# Patient Record
Sex: Male | Born: 1945 | Race: White | Hispanic: No | Smoking: Former smoker
Health system: Southern US, Community
[De-identification: ages and names within clinical notes are randomized; demographics above are authoritative.]

## PROBLEM LIST (undated history)

## (undated) DIAGNOSIS — R0602 Shortness of breath: Secondary | ICD-10-CM

## (undated) DIAGNOSIS — I10 Essential (primary) hypertension: Secondary | ICD-10-CM

## (undated) DIAGNOSIS — N182 Chronic kidney disease, stage 2 (mild): Secondary | ICD-10-CM

## (undated) DIAGNOSIS — N529 Male erectile dysfunction, unspecified: Secondary | ICD-10-CM

## (undated) DIAGNOSIS — K219 Gastro-esophageal reflux disease without esophagitis: Secondary | ICD-10-CM

## (undated) DIAGNOSIS — C61 Malignant neoplasm of prostate: Secondary | ICD-10-CM

## (undated) DIAGNOSIS — I639 Cerebral infarction, unspecified: Secondary | ICD-10-CM

## (undated) DIAGNOSIS — I2510 Atherosclerotic heart disease of native coronary artery without angina pectoris: Secondary | ICD-10-CM

## (undated) DIAGNOSIS — I219 Acute myocardial infarction, unspecified: Secondary | ICD-10-CM

## (undated) DIAGNOSIS — Z86010 Personal history of colonic polyps: Secondary | ICD-10-CM

## (undated) DIAGNOSIS — Z87891 Personal history of nicotine dependence: Secondary | ICD-10-CM

## (undated) DIAGNOSIS — Z85038 Personal history of other malignant neoplasm of large intestine: Secondary | ICD-10-CM

## (undated) DIAGNOSIS — E785 Hyperlipidemia, unspecified: Secondary | ICD-10-CM

## (undated) HISTORY — PX: COLON SURGERY: SHX602

## (undated) HISTORY — DX: Personal history of other malignant neoplasm of large intestine: Z85.038

## (undated) HISTORY — DX: Hyperlipidemia, unspecified: E78.5

## (undated) HISTORY — DX: Gastro-esophageal reflux disease without esophagitis: K21.9

## (undated) HISTORY — DX: Male erectile dysfunction, unspecified: N52.9

## (undated) HISTORY — DX: Personal history of colonic polyps: Z86.010

## (undated) HISTORY — DX: Essential (primary) hypertension: I10

## (undated) HISTORY — DX: Malignant neoplasm of prostate: C61

## (undated) HISTORY — DX: Personal history of nicotine dependence: Z87.891

## (undated) HISTORY — PX: LIVER BIOPSY: SHX301

---

## 2006-09-10 DIAGNOSIS — I219 Acute myocardial infarction, unspecified: Secondary | ICD-10-CM

## 2006-09-10 HISTORY — DX: Acute myocardial infarction, unspecified: I21.9

## 2008-03-24 ENCOUNTER — Observation Stay (HOSPITAL_COMMUNITY): Admission: EM | Admit: 2008-03-24 | Discharge: 2008-03-25 | Payer: Self-pay | Admitting: Emergency Medicine

## 2008-03-24 ENCOUNTER — Ambulatory Visit: Payer: Self-pay | Admitting: Cardiology

## 2008-03-25 ENCOUNTER — Encounter (INDEPENDENT_AMBULATORY_CARE_PROVIDER_SITE_OTHER): Payer: Self-pay | Admitting: Internal Medicine

## 2008-03-25 ENCOUNTER — Ambulatory Visit: Payer: Self-pay | Admitting: Vascular Surgery

## 2008-06-14 ENCOUNTER — Ambulatory Visit: Payer: Self-pay | Admitting: Internal Medicine

## 2008-06-28 ENCOUNTER — Ambulatory Visit: Payer: Self-pay | Admitting: Internal Medicine

## 2008-06-28 ENCOUNTER — Encounter: Payer: Self-pay | Admitting: Family Medicine

## 2008-06-28 LAB — CONVERTED CEMR LAB
ALT: 13 units/L (ref 0–53)
AST: 12 units/L (ref 0–37)
Basophils Absolute: 0 10*3/uL (ref 0.0–0.1)
Basophils Relative: 0 % (ref 0–1)
CO2: 21 meq/L (ref 19–32)
Cholesterol: 126 mg/dL (ref 0–200)
Creatinine, Ser: 1.28 mg/dL (ref 0.40–1.50)
Eosinophils Relative: 3 % (ref 0–5)
HCT: 44 % (ref 39.0–52.0)
LDL Cholesterol: 56 mg/dL (ref 0–99)
Lymphocytes Relative: 33 % (ref 12–46)
MCHC: 31.8 g/dL (ref 30.0–36.0)
Neutro Abs: 3.9 10*3/uL (ref 1.7–7.7)
PSA: 4.53 ng/mL — ABNORMAL HIGH (ref 0.10–4.00)
Platelets: 212 10*3/uL (ref 150–400)
RDW: 13.4 % (ref 11.5–15.5)
Sodium: 143 meq/L (ref 135–145)
TSH: 4.267 microintl units/mL (ref 0.350–4.50)
Total Bilirubin: 0.5 mg/dL (ref 0.3–1.2)
Total CHOL/HDL Ratio: 2.2
Total Protein: 6.3 g/dL (ref 6.0–8.3)
VLDL: 13 mg/dL (ref 0–40)

## 2008-07-05 ENCOUNTER — Ambulatory Visit: Payer: Self-pay | Admitting: Family Medicine

## 2009-08-23 ENCOUNTER — Ambulatory Visit (HOSPITAL_COMMUNITY): Admission: RE | Admit: 2009-08-23 | Discharge: 2009-08-23 | Payer: Self-pay | Admitting: Urology

## 2009-08-26 DIAGNOSIS — E876 Hypokalemia: Secondary | ICD-10-CM

## 2009-08-26 DIAGNOSIS — N182 Chronic kidney disease, stage 2 (mild): Secondary | ICD-10-CM

## 2009-08-26 DIAGNOSIS — E785 Hyperlipidemia, unspecified: Secondary | ICD-10-CM

## 2009-08-26 HISTORY — DX: Chronic kidney disease, stage 2 (mild): N18.2

## 2009-08-26 HISTORY — DX: Hyperlipidemia, unspecified: E78.5

## 2009-08-29 ENCOUNTER — Ambulatory Visit: Payer: Self-pay | Admitting: Internal Medicine

## 2009-08-29 ENCOUNTER — Ambulatory Visit: Admission: RE | Admit: 2009-08-29 | Discharge: 2009-09-09 | Payer: Self-pay | Admitting: Radiation Oncology

## 2009-08-29 DIAGNOSIS — N529 Male erectile dysfunction, unspecified: Secondary | ICD-10-CM

## 2009-08-29 DIAGNOSIS — Z87891 Personal history of nicotine dependence: Secondary | ICD-10-CM

## 2009-08-29 DIAGNOSIS — I10 Essential (primary) hypertension: Secondary | ICD-10-CM

## 2009-08-29 DIAGNOSIS — Z8679 Personal history of other diseases of the circulatory system: Secondary | ICD-10-CM | POA: Insufficient documentation

## 2009-08-29 DIAGNOSIS — K219 Gastro-esophageal reflux disease without esophagitis: Secondary | ICD-10-CM

## 2009-08-29 HISTORY — DX: Male erectile dysfunction, unspecified: N52.9

## 2009-08-29 HISTORY — DX: Personal history of nicotine dependence: Z87.891

## 2009-08-29 HISTORY — DX: Essential (primary) hypertension: I10

## 2009-08-29 HISTORY — DX: Gastro-esophageal reflux disease without esophagitis: K21.9

## 2009-08-29 LAB — CONVERTED CEMR LAB
BUN: 8 mg/dL (ref 6–23)
Basophils Relative: 0.7 % (ref 0.0–3.0)
CO2: 29 meq/L (ref 19–32)
Calcium: 9 mg/dL (ref 8.4–10.5)
Creatinine, Ser: 1.1 mg/dL (ref 0.4–1.5)
Eosinophils Absolute: 0.1 10*3/uL (ref 0.0–0.7)
Eosinophils Relative: 1.5 % (ref 0.0–5.0)
GFR calc non Af Amer: 71.65 mL/min (ref >60)
Glucose, Bld: 93 mg/dL (ref 70–99)
HDL: 68.9 mg/dL (ref >39.00)
Hemoglobin: 14.7 g/dL (ref 13.0–17.0)
MCHC: 34.1 g/dL (ref 30.0–36.0)
MCV: 100 fL (ref 78.0–100.0)
Monocytes Absolute: 0.4 10*3/uL (ref 0.1–1.0)
Neutro Abs: 4.2 10*3/uL (ref 1.4–7.7)
Neutrophils Relative %: 64 % (ref 43.0–77.0)
RBC: 4.3 M/uL (ref 4.22–5.81)
WBC: 6.4 10*3/uL (ref 4.5–10.5)

## 2009-09-14 ENCOUNTER — Ambulatory Visit: Admission: RE | Admit: 2009-09-14 | Discharge: 2009-12-08 | Payer: Self-pay | Admitting: Radiation Oncology

## 2009-11-20 IMAGING — CR DG CHEST 2V
2 series · 2 of 2 positions shown · non-contrast
Comparison: None

CLINICAL DATA: Dizziness, visual problems

CHEST - 2 VIEW

[w chest pa]
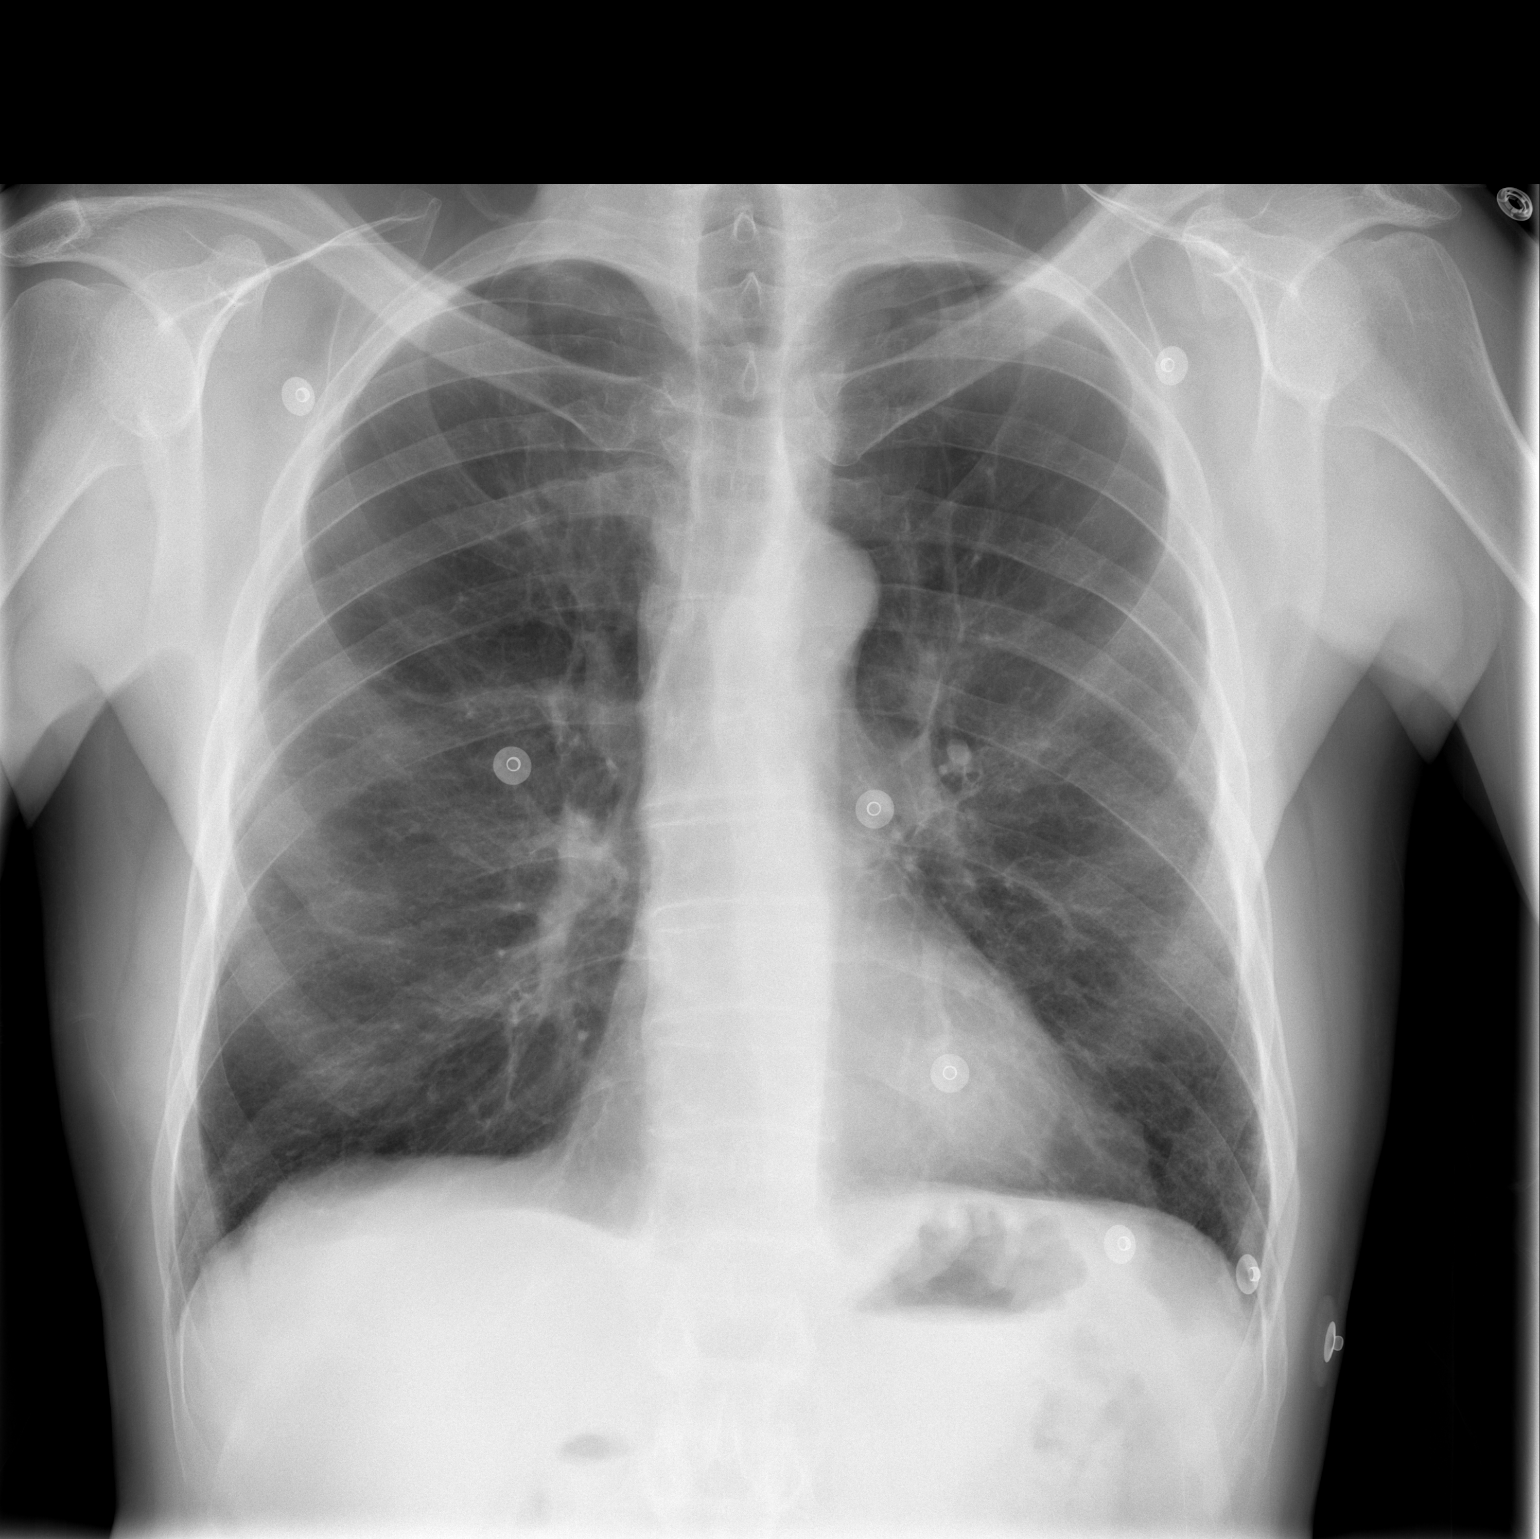

[w chest lat]
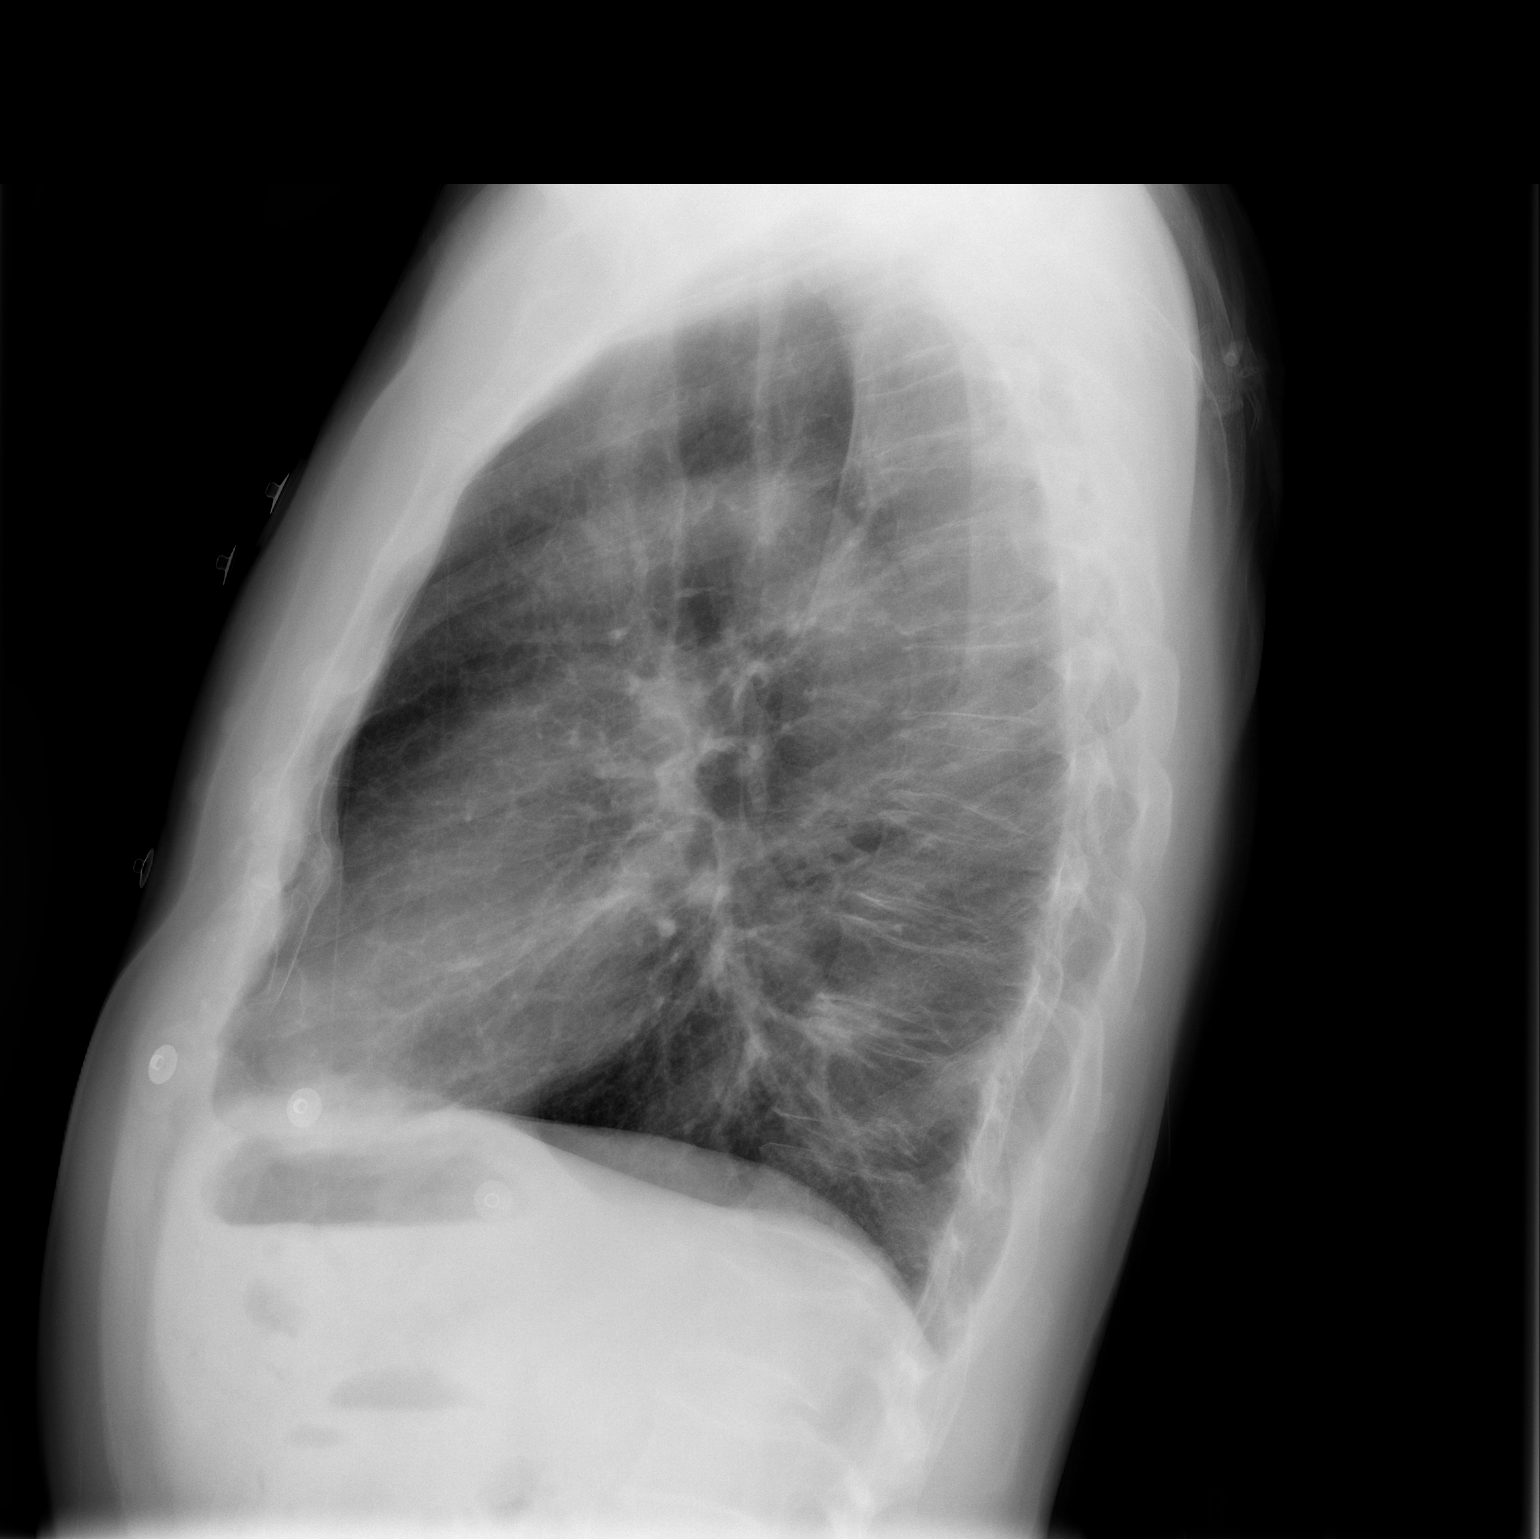

[2 of 2 positions shown; findings below may reference images not displayed]

FINDINGS: The cardiomediastinal silhouette is unremarkable.  Mild
hyperinflation is seen.  No acute infiltrate or pleural effusion.
No pulmonary edema.
IMPRESSION: Mild hyperinflation.  No acute infiltrate or pleural effusion.

## 2009-12-09 ENCOUNTER — Ambulatory Visit: Admission: RE | Admit: 2009-12-09 | Discharge: 2010-02-02 | Payer: Self-pay | Admitting: Radiation Oncology

## 2009-12-27 ENCOUNTER — Ambulatory Visit: Payer: Self-pay | Admitting: Internal Medicine

## 2010-01-09 LAB — CBC WITH DIFFERENTIAL/PLATELET
Basophils Absolute: 0 10*3/uL (ref 0.0–0.1)
Eosinophils Absolute: 0.1 10*3/uL (ref 0.0–0.5)
HGB: 13.9 g/dL (ref 13.0–17.1)
MONO#: 0.4 10*3/uL (ref 0.1–0.9)
NEUT#: 3.8 10*3/uL (ref 1.5–6.5)
Platelets: 229 10*3/uL (ref 140–400)
RBC: 4.14 10*6/uL — ABNORMAL LOW (ref 4.20–5.82)
RDW: 13.1 % (ref 11.0–14.6)
WBC: 5.4 10*3/uL (ref 4.0–10.3)

## 2010-04-25 ENCOUNTER — Ambulatory Visit: Payer: Self-pay | Admitting: Internal Medicine

## 2010-04-25 DIAGNOSIS — K5900 Constipation, unspecified: Secondary | ICD-10-CM | POA: Insufficient documentation

## 2010-04-25 LAB — CONVERTED CEMR LAB
Magnesium: 2.1 mg/dL (ref 1.5–2.5)
Potassium: 4.1 meq/L (ref 3.5–5.1)

## 2010-04-27 ENCOUNTER — Encounter: Payer: Self-pay | Admitting: Gastroenterology

## 2010-05-26 ENCOUNTER — Telehealth: Payer: Self-pay | Admitting: Internal Medicine

## 2010-06-14 ENCOUNTER — Ambulatory Visit: Payer: Self-pay | Admitting: Gastroenterology

## 2010-06-14 DIAGNOSIS — C61 Malignant neoplasm of prostate: Secondary | ICD-10-CM

## 2010-06-14 DIAGNOSIS — Z85038 Personal history of other malignant neoplasm of large intestine: Secondary | ICD-10-CM

## 2010-06-14 HISTORY — DX: Personal history of other malignant neoplasm of large intestine: Z85.038

## 2010-06-14 HISTORY — DX: Malignant neoplasm of prostate: C61

## 2010-06-15 ENCOUNTER — Ambulatory Visit: Payer: Self-pay | Admitting: Gastroenterology

## 2010-06-19 ENCOUNTER — Encounter: Payer: Self-pay | Admitting: Gastroenterology

## 2010-07-19 ENCOUNTER — Telehealth: Payer: Self-pay | Admitting: Internal Medicine

## 2010-07-19 ENCOUNTER — Ambulatory Visit: Payer: Self-pay | Admitting: Gastroenterology

## 2010-07-19 DIAGNOSIS — Z860100 Personal history of colon polyps, unspecified: Secondary | ICD-10-CM

## 2010-07-19 HISTORY — DX: Personal history of colon polyps, unspecified: Z86.0100

## 2010-07-19 HISTORY — DX: Personal history of colonic polyps: Z86.010

## 2010-08-22 ENCOUNTER — Ambulatory Visit: Payer: Self-pay | Admitting: Internal Medicine

## 2010-10-02 ENCOUNTER — Encounter: Payer: Self-pay | Admitting: Internal Medicine

## 2010-10-10 NOTE — Assessment & Plan Note (Signed)
Summary: 4 MTH FU  STC   Vital Signs:  Patient profile:   65 year old male Height:      66 inches (167.64 cm) Weight:      139.0 pounds (63.18 kg) O2 Sat:      97 % on Room air Temp:     97.8 degrees F (36.56 degrees C) oral Pulse rate:   67 / minute BP sitting:   148 / 78  (left arm) Cuff size:   regular  Vitals Entered By: Orlan Leavens (December 27, 2009 1:09 PM)  O2 Flow:  Room air CC: 4 month follow-up/ Req refill on metoprolo, & omprazole Is Patient Diabetic? No Pain Assessment Patient in pain? no        Primary Care Provider:  Newt Lukes MD  CC:  4 month follow-up/ Req refill on metoprolo and & omprazole.  History of Present Illness: here for 4 month followup  1) HTN - reports compliance with ongoing medical treatment and no changes in medication dose or frequency. denies adverse side effects related to current therapy.   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) - no dysarthria, dysphagia or UE weakness - no new signs or symptoms of CVA since '92  4) ?prostate ca - following wit uro (nesi) for same - reports recent bx showed no cancer - denies hematurai or dysuria - occ BPF symptoms   5) hx migraines - none >4weeks, usually 1/ week prior to being on topamax - followed by neuro for same - reports recnt MRI at hosp request refill on this and other meds  6) - ED - previously on viagra, unable to afford levitra - now wants to try cialis - no CP or probs with med or tx - uses 2-3/mo  Clinical Review Panels:  Immunizations   Last Tetanus Booster:  Historical (09/11/2003)  Lipid Management   Cholesterol:  217 (08/29/2009)   LDL (bad choesterol):  56 (06/28/2008)   HDL (good cholesterol):  68.90 (08/29/2009)  CBC   WBC:  6.4 (08/29/2009)   RBC:  4.30 (08/29/2009)  Hgb:  14.7 (08/29/2009)   Hct:  43.0 (08/29/2009)   Platelets:  226.0 (08/29/2009)   MCV  100.0 (08/29/2009)   MCHC  34.1 (08/29/2009)   RDW  12.1 (08/29/2009)   PMN:  64.0 (08/29/2009)   Lymphs:  27.1 (08/29/2009)   Monos:  6.7 (08/29/2009)   Eosinophils:  1.5 (08/29/2009)   Basophil:  0.7 (08/29/2009)  Complete Metabolic Panel   Glucose:  93 (08/29/2009)   Sodium:  140 (08/29/2009)   Potassium:  4.0 (08/29/2009)   Chloride:  105 (08/29/2009)   CO2:  29 (08/29/2009)   BUN:  8 (08/29/2009)   Creatinine:  1.1 (08/29/2009)   Albumin:  4.1 (06/28/2008)   Total Protein:  6.3 (06/28/2008)   Calcium:  9.0 (08/29/2009)   Total Bili:  0.5 (06/28/2008)   Alk Phos:  59 (06/28/2008)   SGPT (ALT):  13 (06/28/2008)   SGOT (AST):  12 (06/28/2008)   Current Medications (verified): 1)  Omeprazole 20 Mg Cpdr (Omeprazole) .... Take 1 By Mouth Qd 2)  Ecotrin 325 Mg Tbec (Aspirin) .... Take 1 By Mouth Qd 3)  Metoprolol Tartrate 25 Mg Tabs (Metoprolol Tartrate) .... Take 1 Two Times A Day 4)  Topiramate  100 Mg Tabs (Topiramate) .... Take 1 At Bedtime 5)  Levitra 10 Mg Tabs (Vardenafil Hcl) .Marland Kitchen.. 1 By Mouth 1 Hour Prior To Intercourse  Allergies (verified): No Known Drug Allergies  Past History:  Past Medical History: Hyperlipidemia Hypertension GERD ED Cerebrovascular accident, hx of -'92, residual RLE weakness prostate cancer, hx   MD rooster: neuro - ?willis uro -nessi rad onc Tawni Millers  Past Surgical History: Reviewed history from 08/29/2009 and no changes required. none  Review of Systems  The patient denies fever, weight loss, chest pain, syncope, and headaches.    Physical Exam  General:  alert, well-developed, well-nourished, and cooperative to examination.    Lungs:  normal respiratory effort, no intercostal retractions or use of accessory muscles; normal breath sounds bilaterally - no crackles and no wheezes.    Heart:  normal rate, regular rhythm, no murmur, and  no rub. BLE without edema.  Neurologic:  alert & oriented X3 and cranial nerves II-XII symetrically intact.  strength normal in all extremities except 4/5  prox RLE, sensation intact to light touch, and gait affected by RLE. speech fluent without dysarthria or aphasia; follows commands with good comprehension.    Impression & Recommendations:  Problem # 1:  HYPERTENSION (ICD-401.9) reports morning AM - His updated medication list for this problem includes:    Metoprolol Tartrate 25 Mg Tabs (Metoprolol tartrate) .Marland Kitchen... Take 1 two times a day  BP today: 148/78 Prior BP: 132/82 (08/29/2009)  Labs Reviewed: K+: 4.0 (08/29/2009) Creat: : 1.1 (08/29/2009)   Chol: 217 (08/29/2009)   HDL: 68.90 (08/29/2009)   LDL: 56 (06/28/2008)   TG: 80.0 (08/29/2009)  Problem # 2:  DYSLIPIDEMIA (ICD-272.4)  Labs Reviewed: SGOT: 12 (06/28/2008)   SGPT: 13 (06/28/2008)   HDL:68.90 (08/29/2009), 57 (06/28/2008)  LDL:56 (06/28/2008)  Chol:217 (08/29/2009), 126 (06/28/2008)  Trig:80.0 (08/29/2009), 66 (06/28/2008)  Problem # 3:  GERD (ICD-530.81)  symptoms well controlled with PPI -   His updated medication list for this problem includes:    Omeprazole 20 Mg Cpdr (Omeprazole) .Marland Kitchen... Take 1 by mouth qd  Labs Reviewed: Hgb: 14.7 (08/29/2009)   Hct: 43.0 (08/29/2009)  Problem # 4:  CEREBROVASCULAR ACCIDENT, HX OF (ICD-V12.50)  residual RLE deficits -  cont med mgmt and risk reduction as able - old hosp records reviewed on hosp EMR system including imaging and labs -  no new MRI evident in [facility] system for me to review-  Complete Medication List: 1)  Omeprazole 20 Mg Cpdr (Omeprazole) .... Take 1 by mouth qd 2)  Ecotrin 325 Mg Tbec (Aspirin) .... Take 1 by mouth qd 3)  Metoprolol Tartrate 25 Mg Tabs (Metoprolol tartrate) .... Take 1 two times a day 4)  Topiramate 100 Mg Tabs (Topiramate) .... Take 1 at bedtime 5)  Cialis 10 Mg Tabs (Tadalafil) .Marland Kitchen.. 1 by mouth every 3 days as needed  Patient  Instructions: 1)  it was good to see you today.  2)  change levitra to cialis as discussed - samples given today and new prescription for same - 3)  no other medication changes - 4)  Please schedule a follow-up appointment in 4 months, sooner if problems.  Prescriptions: CIALIS 10 MG TABS (TADALAFIL) 1 by mouth every 3 days as needed  #3 x 1   Entered and Authorized by:   Newt Lukes MD   Signed by:   Newt Lukes MD on 12/27/2009   Method used:   Print then Give to Patient   RxID:  [id number] METOPROLOL TARTRATE 25 MG TABS (METOPROLOL TARTRATE) take 1 two times a day  #60 x 5   Entered by:   Orlan Leavens   Authorized by:   Newt Lukes MD   Signed by:   Orlan Leavens on 12/27/2009   Method used:   Electronically to        [facility] [phone]* (retail)       [address]       [place], [place]  [address]       Ph: [phone]       Fax: [phone]   RxID:   [id number] OMEPRAZOLE 20 MG CPDR (OMEPRAZOLE) take 1 by mouth qd  #30 x 5   Entered by:   Orlan Leavens   Authorized by:   Newt Lukes MD   Signed by:   Orlan Leavens on 12/27/2009   Method used:   Electronically to        [facility]. [phone]* (retail)       [address]       [place], [place]  [address]       Ph: [phone]       Fax: [phone]   RxID:   [id number]

## 2010-10-10 NOTE — Assessment & Plan Note (Signed)
Summary: return visit after colonoscopy/tcw   History of Present Illness Visit Type: Follow-up Visit Primary GI MD: Melvia Heaps MD [facility] Primary Provider: Newt Lukes MD Requesting Provider: na Chief Complaint: F/u from colon.  Pt states that he feels better but stiil having constipation and stool softner and Miralax is not working and he stopped using them  History of Present Illness:   Terry Villa has returned following colonoscopy.  A large pedunculated adenomatous polyp was removed.  Despite taking MiraLax he still has a difficult time evacuatehis bowels.  He requires manual digitation.  Stools are soft, not hard.   GI Review of Systems      Denies abdominal pain, acid reflux, belching, bloating, chest pain, dysphagia with liquids, dysphagia with solids, heartburn, loss of appetite, nausea, vomiting, vomiting blood, weight loss, and  weight gain.      Reports constipation.     Denies anal fissure, black tarry stools, change in bowel habit, diarrhea, diverticulosis, fecal incontinence, heme positive stool, hemorrhoids, irritable bowel syndrome, jaundice, light color stool, liver problems, rectal bleeding, and  rectal pain.    Current Medications (verified): 1)  Omeprazole 20 Mg Cpdr (Omeprazole) .... Take 1 By Mouth Qd 2)  Ecotrin 325 Mg Tbec (Aspirin) .... Take 1 By Mouth Qd 3)  Metoprolol Tartrate 25 Mg Tabs (Metoprolol Tartrate) .... Take 1 Two Times A Day 4)  Topiramate 100 Mg Tabs (Topiramate) .... Take 1 At Bedtime 5)  Levitra 10 Mg Tabs (Vardenafil Hcl) .Marland Kitchen.. 1 By Mouth As Needed For Sexual Function 6)  Tamsulosin Hcl 0.4 Mg Caps (Tamsulosin Hcl) .... 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 liver biopsy Colon Resection  Family History: unknown per pt -  Family History of Clotting disorder: Uncle Family History of Heart Disease: Father Family History of Breast Cancer:Mother No FH of Colon Cancer:  Social History: Reviewed history from 06/14/2010 and no changes required. Former Smoker widowed, lives alone - but has lady friend enjoys spending time with his 7 g-kids Alcohol Use - yes Daily Caffeine Use Illicit Drug Use - no Occupation: Furniture  Review of Systems       The patient complains of arthritis/joint pain and muscle pains/cramps.  The patient denies allergy/sinus, anemia, anxiety-new, back pain, blood in urine, breast changes/lumps, change in vision, confusion, cough, coughing up blood, depression-new, fainting, fatigue, fever, headaches-new, hearing problems, heart murmur, heart rhythm changes, itching, menstrual pain, night sweats, nosebleeds, pregnancy symptoms, shortness of breath, skin rash, sleeping problems, sore throat, swelling of feet/legs, swollen lymph glands, thirst - excessive , urination - excessive , urination changes/pain, urine leakage, vision changes, and voice change.    Vital Signs:  Patient profile:   65 year old male Height:      66 inches Weight:      142 pounds BMI:     23.00 BSA:     1.73 Pulse rate:   60 / minute Pulse rhythm:   regular BP sitting:  120 / 76  (left arm) Cuff size:   regular  Vitals Entered By: Ok Anis CMA (July 19, 2010 9:25 AM)   Impression & Recommendations:  Problem # 1:   CONSTIPATION (ICD-564.00) He may have a problem with pelvic floor dysfunction where he does not have the strength to evacuate his stool.  Recommendations #1 trial of lactulose 15 cc b.i.d.  If unsuccessful would consider anal manometry  Problem # 2:  PERSONAL HISTORY OF COLONIC POLYPS (ICD-V12.72) Plan followup colonoscopy in 3 years due to  polyp size  Problem # 3:  CARCINOMA, COLON, HX OF (ICD-V10.05) Assessment: Comment Only  Patient Instructions: 1)  Copy sent to : Newt Lukes MD 2)  Your medication will be at your pharmacy today 3)  Call back as needed  4)  The medication list was reviewed and reconciled.  All changed / newly prescribed medications were explained.  A complete medication list was provided to the patient / caregiver. Prescriptions: LACTULOSE 10 GM/15ML SOLN (LACTULOSE) take 1 tablespoon twice a day  #500 x 5   Entered and Authorized by:   Louis Meckel MD   Signed by:   Louis Meckel MD on 07/19/2010   Method used:   Electronically to        [facility]. [phone]* (retail)       [address]       [place], [place]  [address]       Ph: [phone]       Fax: [phone]   RxID:   [id number]

## 2010-10-10 NOTE — Procedures (Signed)
Summary: Colonoscopy  Patient: Terry Villa Note: All result statuses are Final unless otherwise noted.  Tests: (1) Colonoscopy (COL)   COL Colonoscopy           DONE     [facility]     [address]     [place], [place]  [address]           COLONOSCOPY PROCEDURE REPORT           PATIENT:  Terry Villa, Terry Villa  MR#:  [id number]     BIRTHDATE:  05/07/1946, 64 yrs. old  GENDER:  male           ENDOSCOPIST:  Barbette Hair. Arlyce Dice, MD     Referred by:  Rene Paci, M.D.           PROCEDURE DATE:  06/15/2010     PROCEDURE:  Colonoscopy with snare polypectomy     ASA CLASS:  Class II     INDICATIONS:  1) constipation  2) questionable history of colon     cancer           MEDICATIONS:   Fentanyl 50 mcg IV, Versed 6 mg IV           DESCRIPTION OF PROCEDURE:   After the risks benefits and     alternatives of the procedure were thoroughly explained, informed     consent was obtained.  Digital rectal exam was performed and     revealed no abnormalities.   The LB [id number] endoscope     was introduced through the anus and advanced to the cecum, which     was identified by the ileocecal valve, limited by poor     preparation.  Significant amount of retained solid stool in right     colon.  Moderate amount of liquid stool left colon.  The quality     of the prep was Miralax fair.  The instrument was then slowly     withdrawn as the colon was fully examined.     <<PROCEDUREIMAGES>>           FINDINGS:  A pedunculated polyp was found in the sigmoid colon. It     was 1.8 cm in size. It was found 20 cm from the point of entry.     Polyp was snared, then cauterized with monopolar cautery.     Retrieval was successful (see image10 and image11). snare polyp     Mild diverticulosis was found in the sigmoid colon (see image7 and     image6).  This was otherwise a normal examination of the colon     (see image1, image2, image3, image4, image5, image8, and image12).     Retroflexed  views in the rectum revealed no abnormalities.    The     time to cecum =  5.25  minutes. The scope was then withdrawn (time     =  9.25  min) from the patient and the procedure completed.           COMPLICATIONS:  None           ENDOSCOPIC IMPRESSION:     1) 1.8 cm pedunculated polyp in the sigmoid colon     2) Mild diverticulosis in the sigmoid colon     3) Otherwise normal examination     RECOMMENDATIONS:     1) followup colonoscopy in 3 years, because of polyp size and     limitation of today's exam  2) high fiber diet     3) miralax as needed for constipation     4) OV 1 month           REPEAT EXAM:  In 3 year(s) for Colonoscopy.           ______________________________     Barbette Hair. Arlyce Dice, MD           CC:           n.     eSIGNED:   Barbette Hair. Kaplan at 06/15/2010 04:01 PM           Terry Villa, [id number]  Note: An exclamation mark (!) indicates a result that was not dispersed into the flowsheet. Document Creation Date: 06/15/2010 4:03 PM _______________________________________________________________________  (1) Order result status: Final Collection or observation date-time: 06/15/2010 15:51 Requested date-time:  Receipt date-time:  Reported date-time:  Referring Physician:   Ordering Physician: Melvia Heaps [id number]) Specimen Source:  Source: Launa Grill Order Number: [id number] Lab site:   Appended Document: Colonoscopy     Procedures Next Due Date:    Colonoscopy: 06/2013

## 2010-10-10 NOTE — Letter (Signed)
Summary: New Patient letter  [facility]  [address] [place], [place] [address]   Phone: [phone]  Fax: [phone]       04/27/2010 MRN: [id number]  [facility] [address] [place], [place]  [address]  Dear Terry Villa,  Welcome to the Gastroenterology Division at [facility].    You are scheduled to see Dr.  Arlyce Dice  on 06-14-10 at 10:45am on the 3rd floor at [facility], [address]  We ask that you try to arrive at our office 15 minutes prior to your appointment time to allow for check-in.  We would like you to complete the enclosed self-administered evaluation form prior to your visit and bring it with you on the day of your appointment.  We will review it with you.  Also, please bring a complete list of all your medications or, if you prefer, bring the medication bottles and we will list them.  Please bring your insurance card so that we may make a copy of it.  If your insurance requires a referral to see a specialist, please bring your referral form from your primary care physician.  Co-payments are due at the time of your visit and may be paid by cash, check or credit card.     Your office visit will consist of a consult with your physician (includes a physical exam), any laboratory testing he/she may order, scheduling of any necessary diagnostic testing (e.g. x-ray, ultrasound, CT-scan), and scheduling of a procedure (e.g. Endoscopy, Colonoscopy) if required.  Please allow enough time on your schedule to allow for any/all of these possibilities.    If you cannot keep your appointment, please call [phone] to cancel or reschedule prior to your appointment date.  This allows [place] the opportunity to schedule an appointment for another patient in need of care.  If you do not cancel or reschedule by 5 p.m. the business day prior to your appointment date, you will be charged a $50.00 late cancellation/no-show fee.    Thank you for choosing  [facility] for your medical needs.  We appreciate the opportunity to care for you.  Please visit [place] at our website  to learn more about our practice.                     Sincerely,                                                             The Gastroenterology Division

## 2010-10-10 NOTE — Letter (Signed)
Summary: Results Letter  [facility]  [address] [place], [place] [address]   Phone: [phone]  Fax: [phone]        June 14, 2010 MRN: [id number]    [facility] [address] [place], [place]  [address]    Dear Mr. Thomann,  It is my pleasure to have treated you recently as a new patient in my office. I appreciate your confidence and the opportunity to participate in your care.  Since I do have a busy inpatient endoscopy schedule and office schedule, my office hours vary weekly. I am, however, available for emergency calls everyday through my office. If I am not available for an urgent office appointment, another one of our gastroenterologist will be able to assist you.  My well-trained staff are prepared to help you at all times. For emergencies after office hours, a physician from our Gastroenterology section is always available through my 24 hour answering service  Once again I welcome you as a new patient and I look forward to a happy and healthy relationship             Sincerely,  Louis Meckel MD  This letter has been electronically signed by your physician.  Appended Document: Results Letter LETTER MAILED

## 2010-10-10 NOTE — Letter (Signed)
Summary: Patient Notice- Polyp Results  [facility]  [address] [place], [place] [address]   Phone: [phone]  Fax: [phone]        June 19, 2010 MRN: [id number]    [facility] [address] [place], [place]  [address]    Dear Mr. Gens,  I am pleased to inform you that the colon polyp(s) removed during your recent colonoscopy was (were) found to be benign (no cancer detected) upon pathologic examination.  I recommend you have a repeat colonoscopy examination in 3_ years to look for recurrent polyps, as having colon polyps increases your risk for having recurrent polyps or even colon cancer in the future.  Should you develop new or worsening symptoms of abdominal pain, bowel habit changes or bleeding from the rectum or bowels, please schedule an evaluation with either your primary care physician or with me.  Additional information/recommendations:  __ No further action with gastroenterology is needed at this time. Please      follow-up with your primary care physician for your other healthcare      needs.  __ Please call [phone] to schedule a return visit to review your      situation.  __ Please keep your follow-up visit as already scheduled.  _x_ Continue treatment plan as outlined the day of your exam.  Please call us if you are having persistent problems or have questions about your condition that have not been fully answered at this time.  Sincerely,  Louis Meckel MD  This letter has been electronically signed by your physician.  Appended Document: Patient Notice- Polyp Results letter mailed

## 2010-10-10 NOTE — Progress Notes (Signed)
Summary: migraine med  Phone Note Call from Patient Call back at Home Phone [phone]   Caller: Patient Call For: Newt Lukes MD Reason for Call: Refill Medication, Talk to Doctor Summary of Call: Pt left msg on vm need refill on his migrane med. (was'nt sure of name). Pls send to [facility]. I look in EMR ? topamx is this ok to refill? Initial call taken by: Orlan Leavens RMA,  May 26, 2010 10:00 AM  Follow-up for Phone Call        yes - may fill topamax as prev rx'd Follow-up by: Newt Lukes MD,  May 26, 2010 10:20 AM  Additional Follow-up for Phone Call Additional follow up Details #1::        Notified pt med sent to [facility] Additional Follow-up by: Orlan Leavens RMA,  May 26, 2010 11:52 AM    Prescriptions: TOPIRAMATE 100 MG TABS (TOPIRAMATE) take 1 at bedtime  #30 Each x 2   Entered by:   Orlan Leavens RMA   Authorized by:   Newt Lukes MD   Signed by:   Orlan Leavens RMA on 05/26/2010   Method used:   Electronically to        [facility] [phone]* (retail)       [address]       [place], [place]  [address]       Ph: [phone]       Fax: [phone]   RxID:   [id number]

## 2010-10-10 NOTE — Progress Notes (Signed)
Summary: REFILL  Phone Note Call from Patient Call back at Home Phone [phone]   Caller: Patient Reason for Call: Talk to Nurse Summary of Call: PT NEEDS REFILL ON ALL OF HIS MEDS, PLEASE CALL INTO [facility] [address] Initial call taken by: Migdalia Dk,  July 19, 2010 10:03 AM  Follow-up for Phone Call        Called pt no ansew [facility] rx's sent to [facility], also left msg to keep 08/22/10 appt for addtional refills Follow-up by: Orlan Leavens RMA,  July 19, 2010 10:26 AM    Prescriptions: TAMSULOSIN HCL 0.4 MG CAPS (TAMSULOSIN HCL) at bedtime  #30 x 1   Entered by:   Orlan Leavens RMA   Authorized by:   Newt Lukes MD   Signed by:   Orlan Leavens RMA on 07/19/2010   Method used:   Electronically to        [facility] [phone]* (retail)       [address]       [place], [place]  [address]       Ph: [phone]       Fax: [phone]   RxID:   [id number] LEVITRA 10 MG TABS (VARDENAFIL HCL) 1 by mouth as needed for sexual function  #3 x 0   Entered by:   Orlan Leavens RMA   Authorized by:   Newt Lukes MD   Signed by:   Orlan Leavens RMA on 07/19/2010   Method used:   Electronically to        [facility] [phone]* (retail)       [address]       [place], [place]  [address]       Ph: [phone]       Fax: [phone]   RxID:   [id number] TOPIRAMATE 100 MG TABS (TOPIRAMATE) take 1 at bedtime  #30 Each x 1   Entered by:   Orlan Leavens RMA   Authorized by:   Newt Lukes MD   Signed by:   Orlan Leavens RMA on 07/19/2010   Method used:   Electronically to        [facility] [phone]* (retail)       [address]       [place], [place]  [address]       Ph: [phone]       Fax: [phone]   RxID:   [id number] METOPROLOL TARTRATE 25 MG TABS (METOPROLOL TARTRATE) take 1 two times a day  #60 Each x 1   Entered by:   Orlan Leavens RMA   Authorized by:   Newt Lukes MD   Signed by:   Orlan Leavens RMA on 07/19/2010   Method used:   Electronically to        [facility] [phone]* (retail)       [address]       [place], [place]  [address]       Ph: [phone]       Fax: [phone]   RxID:   [id number] OMEPRAZOLE 20 MG CPDR (OMEPRAZOLE) take 1 by mouth qd  #30 x 1   Entered by:   Orlan Leavens RMA   Authorized by:   Raenette Rover  Felicity Coyer MD   Signed by:   Orlan Leavens RMA on 07/19/2010   Method used:   Electronically to        [facility] [phone]* (retail)       [address]       [place], [place]  [address]       Ph: [phone]       Fax: [phone]   RxID:   [id number]

## 2010-10-10 NOTE — Assessment & Plan Note (Signed)
Summary: CONTIPATION/YF   History of Present Illness Visit Type: Initial Consult Primary GI MD: Melvia Heaps MD [facility] Primary Provider: Newt Lukes MD Requesting Provider: Rene Paci, MD Chief Complaint: constipation History of Present Illness:   Terry Villa is a 65 year old white male referred at the request of Dr. Felicity Coyer for evaluation of change in bowel habits.  The patient has a history of colon cancer that apparently was resected a year ago.  He is undergoing radiation therapy for prostate cancer.  Over the past few months, coincident with radiation therapy, he has noted the inability to move his bowels without manually disimpacting himself.  Stools are dark but he denies frank rectal bleeding.  With constipation he may have some lower abdominal discomfort.   GI Review of Systems    Reports abdominal pain, acid reflux, belching, bloating, chest pain, loss of appetite, nausea, and  vomiting.      Denies dysphagia with liquids, dysphagia with solids, heartburn, vomiting blood, weight loss, and  weight gain.      Reports black tarry stools, change in bowel habits, constipation, diarrhea, hemorrhoids, and  rectal pain.     Denies anal fissure, diverticulosis, fecal incontinence, heme positive stool, irritable bowel syndrome, jaundice, light color stool, liver problems, and  rectal bleeding. Preventive Screening-Counseling & Management      Drug Use:  no.      Current Medications (verified): 1)  Omeprazole 20 Mg Cpdr (Omeprazole) .... Take 1 By Mouth Qd 2)  Ecotrin 325 Mg Tbec (Aspirin) .... Take 1 By Mouth Qd 3)  Metoprolol Tartrate 25 Mg Tabs (Metoprolol Tartrate) .... Take 1 Two Times A Day 4)  Topiramate 100 Mg Tabs (Topiramate) .... Take 1 At Bedtime 5)  Polyethylene Glycol 3350  Powd (Polyethylene Glycol 3350) .Marland Kitchen.. 1 Tsp in 8 Oz Water Every Morning As Needed For Constipation - Hold If Diarrhea 6)  Levitra 10 Mg Tabs (Vardenafil Hcl) .Marland Kitchen.. 1 By Mouth As Needed For  Sexual Function 7)  Senokot 8.6 Mg Tabs (Sennosides) .... Once Daily 8)  Tamsulosin Hcl 0.4 Mg Caps (Tamsulosin Hcl) .... At Bedtime  Allergies (verified): No Known Drug Allergies  Past History:  Past Medical History: Hyperlipidemia Hypertension GERD ED Cerebrovascular accident, hx of -'92, residual RLE weakness prostate cancer, hx MD roster: neuro - prev willis uro -nessi rad onc - murrary Small Bowel Obstruction  Past Surgical History: liver biopsy Colon Resection  Family History: unknown per pt -  Family History of Clotting disorder: Uncle Family History of Heart Disease: Father Family History of Breast Cancer:Mother  Social History: Former Smoker widowed, lives alone - but has lady friend enjoys spending time with his 7 g-kids Alcohol Use - yes Daily Caffeine Use Illicit Drug Use - no Occupation: Physiological scientist Drug Use:  no  Review of Systems       The patient complains of depression-new, fatigue, headaches-new, hearing problems, night sweats, and thirst - excessive.  The patient denies allergy/sinus, anemia, anxiety-new, arthritis/joint pain, back pain, blood in urine, breast changes/lumps, change in vision, confusion, cough, coughing up blood, fainting, fever, heart murmur, heart rhythm changes, itching, menstrual pain, muscle pains/cramps, nosebleeds, pregnancy symptoms, shortness of breath, skin rash, sleeping problems, sore throat, swelling of feet/legs, swollen lymph glands, thirst - excessive , urination - excessive , urination changes/pain, urine leakage, vision changes, and voice change.         All other systems were reviewed and were negative   Vital Signs:  Patient profile:   64 year  old male Height:      66 inches Weight:      141 pounds BMI:     22.84 Pulse rate:   64 / minute Pulse rhythm:   regular BP sitting:   150 / 80  (left arm) Cuff size:   regular  Vitals Entered By: June McMurray CMA Duncan Dull) (June 14, 2010 10:39 AM)  Physical  Exam  Additional Exam:  Onphysical exam he is a thin male  skin: anicteric HEENT: normocephalic; PEERLA; no nasal or pharyngeal abnormalities neck: supple nodes: no cervical lymphadenopathy chest: clear to ausculatation and percussion heart: no murmurs, gallops, or rubs abd: soft, nontender; BS normoactive; no abdominal masses, tenderness, organomegaly rectal: deferred ext: no cynanosis, clubbing, edema skeletal: no deformities neuro: oriented x 3; he is a partial right hemiparesis    Impression & Recommendations:  Problem # 1:  CONSTIPATION (ICD-564.00) Assessment Comment Only  This could be related to his radiation therapy.  A structural lesion of the colon should be ruled out, especially in view of his history of colon cancer.  Recommendations #1 colonoscopy  Risks, alternatives, and complications of the procedure, including bleeding, perforation, and possible need for surgery, were explained to the patient.  Patient's questions were answered.  Orders: Colonoscopy (Colon)  Problem # 2:  CARCINOMA, COLON, HX OF (ICD-V10.05)  Plan colonoscopy  Orders: Colonoscopy (Colon)  Problem # 3:  CEREBROVASCULAR ACCIDENT, HX OF (ICD-V12.50) Assessment: Comment Only  Problem # 4:  ADENOCARCINOMA, PROSTATE (ICD-185) Assessment: Comment Only  Patient Instructions: 1)  Copy sent to : Newt Lukes MD 2)  Your Colonoscopy is scheduled for 06/15/2010 at 3pm 3)  Pick up your MoviPrep today 4)  The medication list was reviewed and reconciled.  All changed / newly prescribed medications were explained.  A complete medication list was provided to the patient / caregiver. Prescriptions: MOVIPREP 100 GM  SOLR (PEG-KCL-NACL-NASULF-NA ASC-C) As per prep instructions.  #1 x 0   Entered by:   Merri Ray CMA (AAMA)   Authorized by:   Louis Meckel MD   Signed by:   Merri Ray CMA (AAMA) on 06/14/2010   Method used:   Electronically to        [facility]  [phone]* (retail)       [address]       [place], [place]  [address]       Ph: [phone]       Fax: [phone]   RxID:   [id number] MOVIPREP 100 GM  SOLR (PEG-KCL-NACL-NASULF-NA ASC-C) As per prep instructions.  #1 x 0   Entered by:   Merri Ray CMA (AAMA)   Authorized by:   Louis Meckel MD   Signed by:   Merri Ray CMA (AAMA) on 06/14/2010   Method used:   Historical   RxID:   [id number]

## 2010-10-10 NOTE — Assessment & Plan Note (Signed)
Summary: 4 mos / # / cd   Vital Signs:  Patient profile:   65 year old male Height:      66 inches (167.64 cm) Weight:      138.6 pounds (63.00 kg) O2 Sat:      97 % on Room air Temp:     97.7 degrees F (36.50 degrees C) oral Pulse rate:   53 / minute BP sitting:   124 / 72  (left arm) Cuff size:   regular  Vitals Entered By: Orlan Leavens RMA (April 25, 2010 8:36 AM)  O2 Flow:  Room air CC: 4 month follow-up Is Patient Diabetic? No Pain Assessment Patient in pain? no      Comments Pt complaining of constipation & cramp in both legs   Primary Care Nyriah Coote:  Newt Lukes MD  CC:  4 month follow-up.  History of Present Illness: here for 4 month followup  1) HTN - reports compliance with ongoing medical treatment and no changes in medication dose or frequency. denies adverse side effects related to current therapy. no CP or HA or edema  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) - no dysarthria, dysphagia or UE weakness - no new signs or symptoms of CVA since '92  4) ?prostate ca - following with uro (nesi) for same - reports recent bx showed no cancer - denies hematurai or dysuria - occ BPF symptoms   5) hx migraines - none  in many months, usually 1/ week prior to being on topamax - prev followed by neuro for same - reports recnt MRI at hosp request refill on this and other meds  6) ED - previously on viagra, unable to afford levitra or cialis no CP or probs with med or tx - uses 2-3/mo would like samples if available  Clinical Review Panels:  Prevention   Last PSA:  4.53 (06/28/2008)  Immunizations   Last Tetanus Booster:  Historical (09/11/2003)  Lipid Management   Cholesterol:  217 (08/29/2009)   LDL (bad choesterol):  56 (06/28/2008)   HDL (good cholesterol):   16.10 (08/29/2009)  CBC   WBC:  6.4 (08/29/2009)   RBC:  4.30 (08/29/2009)   Hgb:  14.7 (08/29/2009)   Hct:  43.0 (08/29/2009)   Platelets:  226.0 (08/29/2009)   MCV  100.0 (08/29/2009)   MCHC  34.1 (08/29/2009)   RDW  12.1 (08/29/2009)   PMN:  64.0 (08/29/2009)   Lymphs:  27.1 (08/29/2009)   Monos:  6.7 (08/29/2009)   Eosinophils:  1.5 (08/29/2009)   Basophil:  0.7 (08/29/2009)  Complete Metabolic Panel   Glucose:  93 (08/29/2009)   Sodium:  140 (08/29/2009)   Potassium:  4.0 (08/29/2009)   Chloride:  105 (08/29/2009)   CO2:  29 (08/29/2009)   BUN:  8 (08/29/2009)   Creatinine:  1.1 (08/29/2009)   Albumin:  4.1 (06/28/2008)   Total Protein:  6.3 (06/28/2008)   Calcium:  9.0 (08/29/2009)   Total Bili:  0.5 (06/28/2008)   Alk Phos:  59 (06/28/2008)   SGPT (ALT):  13 (06/28/2008)   SGOT (AST):  12 (06/28/2008)   Current Medications (verified): 1)  Omeprazole 20 Mg Cpdr (Omeprazole) .... Take 1 By Mouth Qd 2)  Ecotrin 325 Mg Tbec (Aspirin) .... Take 1 By Mouth Qd 3)  Metoprolol Tartrate 25 Mg Tabs (Metoprolol Tartrate) .... Take 1 Two Times A Day 4)  Topiramate 100 Mg Tabs (Topiramate) .... Take 1 At Bedtime 5)  Cialis 10 Mg Tabs (Tadalafil) .Marland Kitchen.. 1 By Mouth Every 3 Days As Needed  Allergies (verified): No Known Drug Allergies  Past History:  Past Medical History: Hyperlipidemia Hypertension GERD ED Cerebrovascular accident, hx of -'92, residual RLE weakness prostate cancer, hx   MD roster: neuro - prev willis uro -nessi rad onc - murrary  Review of Systems  The patient denies fever, weight loss, headaches, and abdominal pain.         c/o constipation  Physical Exam  General:  alert, well-developed, well-nourished, and cooperative to examination.    Lungs:  normal respiratory effort, no intercostal retractions or use of accessory muscles; normal breath sounds bilaterally - no crackles and no wheezes.    Heart:  normal rate, regular rhythm, no murmur,  and no rub. BLE without edema.  Abdomen:  soft, non-tender, normal bowel sounds, no distention; no masses and no appreciable hepatomegaly or splenomegaly.   Neurologic:  alert & oriented X3 and cranial nerves II-XII symetrically intact.  strength normal in all extremities except 4/5  prox RLE, sensation intact to light touch, and gait affected by RLE. speech fluent without dysarthria or aphasia; follows commands with good comprehension.    Impression & Recommendations:  Problem # 1:  HYPERTENSION (ICD-401.9)  His updated medication list for this problem includes:    Metoprolol Tartrate 25 Mg Tabs (Metoprolol tartrate) .Marland Kitchen... Take 1 two times a day  BP today: 124/72 Prior BP: 148/78 (12/27/2009)  Labs Reviewed: K+: 4.0 (08/29/2009) Creat: : 1.1 (08/29/2009)   Chol: 217 (08/29/2009)   HDL: 68.90 (08/29/2009)   LDL: 56 (06/28/2008)   TG: 80.0 (08/29/2009)  Problem # 2:  DYSLIPIDEMIA (ICD-272.4) hx sme, not currently on med tx -  Labs Reviewed: SGOT: 12 (06/28/2008)   SGPT: 13 (06/28/2008)   HDL:68.90 (08/29/2009), 57 (06/28/2008)  LDL:56 (06/28/2008)  Chol:217 (08/29/2009), 126 (06/28/2008)  Trig:80.0 (08/29/2009), 66 (06/28/2008)  Problem # 3:  CEREBROVASCULAR ACCIDENT, HX OF (ICD-V12.50)  residual RLE deficits -  cont med mgmt and risk reduction as able - old hosp records prev reviewed on hosp EMR system including imaging and labs -  no new MRI evident in [facility] system for me to review-  Problem # 4:  HYPOKALEMIA (ICD-276.8)  c/o at bedtime leg cramps - check K/Mg now  Orders: TLB-Potassium (K+) (84132-K) TLB-Magnesium (Mg) (83735-MG)  Problem # 5:  CONSTIPATION (ICD-564.00)  no slowing meds -  add PEG once daily and refer to GI for ?colo -  last colo done 75yr ago in [facility] for screening - pt reports neg but no formal report available to review  His updated medication list for this problem includes:    Polyethylene Glycol 3350 Powd (Polyethylene glycol 3350) .Marland Kitchen... 1 tsp in  8 oz water every morning as needed for constipation - hold if diarrhea  Orders: Prescription Created Electronically [phone]) Gastroenterology Referral (GI)  Problem # 6:  ERECTILE DYSFUNCTION, ORGANIC (ICD-607.84)  The following medications were removed from the medication list:    Cialis 10 Mg Tabs (Tadalafil) .Marland Kitchen... 1 by mouth every 3 days as needed His updated medication list for this problem includes:    Levitra 10 Mg Tabs (Vardenafil hcl) .Marland Kitchen... 1 by mouth as needed for sexual function  request levitra rather than viagra (per friend's rec) - ok to change - new rx provided no  anginal symptoms or adv SE  Discussed proper use of medications, as well as side effects.   Complete Medication List: 1)  Omeprazole 20 Mg Cpdr (Omeprazole) .... Take 1 by mouth qd 2)  Ecotrin 325 Mg Tbec (Aspirin) .... Take 1 by mouth qd 3)  Metoprolol Tartrate 25 Mg Tabs (Metoprolol tartrate) .... Take 1 two times a day 4)  Topiramate 100 Mg Tabs (Topiramate) .... Take 1 at bedtime 5)  Polyethylene Glycol 3350 Powd (Polyethylene glycol 3350) .Marland Kitchen.. 1 tsp in 8 oz water every morning as needed for constipation - hold if diarrhea 6)  Levitra 10 Mg Tabs (Vardenafil hcl) .Marland Kitchen.. 1 by mouth as needed for sexual function  Patient Instructions: 1)  it was good to see you today. 2)  test(s) ordered today - your results will be posted on the phone tree for review in 48-72 hours from the time of test completion; call [phone] and enter your 9 digit MRN (listed above on this page, just below your name); if any changes need to be made or there are abnormal results, you will be contacted directly. 3)  samples levitra given today 4)  use polyethlene powder mixed in water as discussed for constipation symptoms  5)  we'll make referral to GI for colonoscopy screening. Our office will contact you regarding this appointment once made.  6)  Please schedule a follow-up appointment in 4 months to review mdications and blood  pressure, sooner if problems.  Prescriptions: LEVITRA 10 MG TABS (VARDENAFIL HCL) 1 by mouth as needed for sexual function  #3 x 0   Entered and Authorized by:   Newt Lukes MD   Signed by:   Newt Lukes MD on 04/25/2010   Method used:   Historical   RxID:   [id number] POLYETHYLENE GLYCOL 3350  POWD (POLYETHYLENE GLYCOL 3350) 1 tsp in 8 oz water every morning as needed for constipation - hold if diarrhea  #1 bottle x 1   Entered and Authorized by:   Newt Lukes MD   Signed by:   Newt Lukes MD on 04/25/2010   Method used:   Electronically to        [facility] [phone]* (retail)       [address]       [place], [place]  [address]       Ph: [phone]       Fax: [phone]   RxID:   [id number]

## 2010-10-10 NOTE — Letter (Signed)
Summary: [facility] Instructions  [facility]  [address] [place], [place] [address]   Phone: [phone]  Fax: [phone]       ELDIN BONSELL    1946-06-16    MRN: [id number]        Procedure Day /Date:THURSDAY 06/15/2010     Arrival Time:2PM     Procedure Time:3PM     Location of Procedure:                    X  [facility] (4th Floor)   PREPARATION FOR COLONOSCOPY WITH MOVIPREP   Starting 5 days prior to your procedure 10/1/2011do not eat nuts, seeds, popcorn, corn, beans, peas,  salads, or any raw vegetables.  Do not take any fiber supplements (e.g. Metamucil, Citrucel, and Benefiber).  THE DAY BEFORE YOUR PROCEDURE         DATE: 06/14/2010  DAY: WED  1.  Drink clear liquids the entire day-NO SOLID FOOD  2.  Do not drink anything colored red or purple.  Avoid juices with pulp.  No orange juice.  3.  Drink at least 64 oz. (8 glasses) of fluid/clear liquids during the day to prevent dehydration and help the prep work efficiently.  CLEAR LIQUIDS INCLUDE: Water Jello Ice Popsicles Tea (sugar ok, no milk/cream) Powdered fruit flavored drinks Coffee (sugar ok, no milk/cream) Gatorade Juice: apple, white grape, white cranberry  Lemonade Clear bullion, consomm, broth Carbonated beverages (any kind) Strained chicken noodle soup Hard Candy                             4.  In the morning, mix first dose of MoviPrep solution:    Empty 1 Pouch A and 1 Pouch B into the disposable container    Add lukewarm drinking water to the top line of the container. Mix to dissolve    Refrigerate (mixed solution should be used within 24 hrs)  5.  Begin drinking the prep at 5:00 p.m. The MoviPrep container is divided by 4 marks.   Every 15 minutes drink the solution down to the next mark (approximately 8 oz) until the full liter is complete.   6.  Follow completed prep with 16 oz of clear liquid of your choice (Nothing red or purple).  Continue to drink  clear liquids until bedtime.  7.  Before going to bed, mix second dose of MoviPrep solution:    Empty 1 Pouch A and 1 Pouch B into the disposable container    Add lukewarm drinking water to the top line of the container. Mix to dissolve    Refrigerate  THE DAY OF YOUR PROCEDURE      DATE: 06/15/2010 DAY: THURSDAY  Beginning at10a.m. (5 hours before procedure):         1. Every 15 minutes, drink the solution down to the next mark (approx 8 oz) until the full liter is complete.  2. Follow completed prep with 16 oz. of clear liquid of your choice.    3. You may drink clear liquids until1PM (2 HOURS BEFORE PROCEDURE).   MEDICATION INSTRUCTIONS  Unless otherwise instructed, you should take regular prescription medications with a small sip of water   as early as possible the morning of your procedure.          OTHER INSTRUCTIONS  You will need a responsible adult at least 65 years of age to accompany you and drive you  home.   This person must remain in the waiting room during your procedure.  Wear loose fitting clothing that is easily removed.  Leave jewelry and other valuables at home.  However, you may wish to bring a book to read or  an iPod/MP3 player to listen to music as you wait for your procedure to start.  Remove all body piercing jewelry and leave at home.  Total time from sign-in until discharge is approximately 2-3 hours.  You should go home directly after your procedure and rest.  You can resume normal activities the  day after your procedure.  The day of your procedure you should not:   Drive   Make legal decisions   Operate machinery   Drink alcohol   Return to work  You will receive specific instructions about eating, activities and medications before you leave.    The above instructions have been reviewed and explained to me by   _______________________    I fully understand and can verbalize these instructions  _____________________________ Date _________

## 2010-10-12 NOTE — Assessment & Plan Note (Signed)
Summary: 4 month follow-up/lmb   Vital Signs:  Patient profile:   65 year old male Height:      66 inches (167.64 cm) Weight:      141.0 pounds (64.09 kg) O2 Sat:      98 % on Room air Temp:     97.0 degrees F (36.11 degrees C) oral Pulse rate:   57 / minute BP sitting:   140 / 80  (left arm) Cuff size:   regular  Vitals Entered By: Orlan Leavens RMA (August 22, 2010 9:23 AM)  O2 Flow:  Room air CC: 4 month follow-up Is Patient Diabetic? No Pain Assessment Patient in pain? no      Comments Req refills on meds   Primary Care Provider:  Newt Lukes MD  CC:  4 month follow-up.  History of Present Illness: here for 4 month followup  1) HTN - reports compliance with ongoing medical treatment and no changes in medication dose or frequency. denies adverse side effects related to current therapy. no CP or HA or edema  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) - no dysarthria, dysphagia or UE weakness - no new signs or symptoms of CVA since '92  4) ?prostate ca - following with uro (nesi) for same - reports recent bx showed no cancer - denies hematurai or dysuria - occ BPF symptoms   5) hx migraines - none  in many months, usually 1/ week prior to being on topamax - prev followed by neuro for same - reports recnt MRI at hosp request refill on this and other meds  6) ED - previously on viagra, unable to afford levitra or cialis no CP or probs with med or tx - uses 2-3/mo would like samples if available  Clinical Review Panels:  Prevention   Last Colonoscopy:  DONE (06/15/2010)   Last PSA:  4.53 (06/28/2008)  Lipid Management   Cholesterol:  217 (08/29/2009)   LDL (bad choesterol):  56 (06/28/2008)   HDL (good cholesterol):  68.90 (08/29/2009)  CBC   WBC:  6.4  (08/29/2009)   RBC:  4.30 (08/29/2009)   Hgb:  14.7 (08/29/2009)   Hct:  43.0 (08/29/2009)   Platelets:  226.0 (08/29/2009)   MCV  100.0 (08/29/2009)   MCHC  34.1 (08/29/2009)   RDW  12.1 (08/29/2009)   PMN:  64.0 (08/29/2009)   Lymphs:  27.1 (08/29/2009)   Monos:  6.7 (08/29/2009)   Eosinophils:  1.5 (08/29/2009)   Basophil:  0.7 (08/29/2009)  Complete Metabolic Panel   Glucose:  93 (08/29/2009)   Sodium:  140 (08/29/2009)   Potassium:  4.1 (04/25/2010)   Chloride:  105 (08/29/2009)   CO2:  29 (08/29/2009)   BUN:  8 (08/29/2009)   Creatinine:  1.1 (08/29/2009)   Albumin:  4.1 (06/28/2008)   Total Protein:  6.3 (06/28/2008)   Calcium:  9.0 (08/29/2009)   Total Bili:  0.5 (06/28/2008)   Alk Phos:  59 (06/28/2008)   SGPT (ALT):  13 (06/28/2008)   SGOT (AST):  12 (06/28/2008)   Current Medications (verified): 1)  Omeprazole 20 Mg Cpdr (Omeprazole) .... Take 1 By Mouth Qd 2)  Ecotrin 325 Mg Tbec (Aspirin) .... Take 1 By Mouth Qd 3)  Metoprolol Tartrate 25 Mg Tabs (Metoprolol Tartrate) .... Take 1  Two Times A Day 4)  Topiramate 100 Mg Tabs (Topiramate) .... Take 1 At Bedtime 5)  Levitra 10 Mg Tabs (Vardenafil Hcl) .Marland Kitchen.. 1 By Mouth As Needed For Sexual Function 6)  Tamsulosin Hcl 0.4 Mg Caps (Tamsulosin Hcl) .... At Bedtime 7)  Lactulose 10 Gm/4ml Soln (Lactulose) .... Take 1 Tablespoon Twice A Day  Allergies (verified): No Known Drug Allergies  Past History:  Past Medical History: Hyperlipidemia Hypertension GERD ED Cerebrovascular accident, hx of -'92, residual RLE weakness prostate cancer, hx chronic constipation migraines - on topamax  MD roster: neuro - prev willis uro -nessi rad onc - [facility] - [facility]  Review of Systems  The patient denies fever, weight loss, hoarseness, chest pain, headaches, and abdominal pain.    Physical Exam  General:  alert, well-developed, well-nourished, and cooperative to examination.    Lungs:  normal respiratory  effort, no intercostal retractions or use of accessory muscles; normal breath sounds bilaterally - no crackles and no wheezes.    Heart:  normal rate, regular rhythm, no murmur, and no rub. BLE without edema.  Neurologic:  alert & oriented X3 and cranial nerves II-XII symetrically intact.  strength normal in all extremities except 4/5  prox RLE, sensation intact to light touch, and gait affected by RLE. speech fluent without dysarthria or aphasia; follows commands with good comprehension.    Impression & Recommendations:  Problem # 1:  ERECTILE DYSFUNCTION, ORGANIC (ICD-607.84)  His updated medication list for this problem includes:    Levitra 10 Mg Tabs (Vardenafil hcl) .Marland Kitchen... 1 by mouth as needed for sexual function  request cheaper medication - explained none likely to be covered by insurance but may notify us if his plan has alt covered med ok to use levtra, viagra or cialis as needed  no anginal symptoms or adv SE  Discussed proper use of medications, as well as side effects.   Problem # 2:  HYPERTENSION (ICD-401.9)  His updated medication list for this problem includes:    Metoprolol Tartrate 25 Mg Tabs (Metoprolol tartrate) .Marland Kitchen... Take 1 two times a day  BP today: 140/80 Prior BP: 120/76 (07/19/2010)  Labs Reviewed: K+: 4.1 (04/25/2010) Creat: : 1.1 (08/29/2009)   Chol: 217 (08/29/2009)   HDL: 68.90 (08/29/2009)   LDL: 56 (06/28/2008)   TG: 80.0 (08/29/2009)  Problem # 3:  DYSLIPIDEMIA (ICD-272.4)  hx same, not currently on med tx -   Labs Reviewed: SGOT: 12 (06/28/2008)   SGPT: 13 (06/28/2008)   HDL:68.90 (08/29/2009), 57 (06/28/2008)  LDL:56 (06/28/2008)  Chol:217 (08/29/2009), 126 (06/28/2008)  Trig:80.0 (08/29/2009), 66 (06/28/2008)  Problem # 4:  GERD (ICD-530.81)  His updated medication list for this problem includes:    Omeprazole 20 Mg Cpdr (Omeprazole) .Marland Kitchen... 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 Take 1 by mouth qd 2)  Ecotrin 325 Mg Tbec (Aspirin) .... Take 1 by mouth qd 3)  Metoprolol Tartrate 25 Mg Tabs (Metoprolol tartrate) .... Take 1 two times a day 4)  Topiramate 100 Mg Tabs (Topiramate) .... Take  1 at bedtime 5)  Levitra 10 Mg Tabs (Vardenafil hcl) .Marland Kitchen.. 1 by mouth as needed for sexual function 6)  Tamsulosin Hcl 0.4 Mg Caps (Tamsulosin hcl) .... At bedtime 7)  Lactulose 10 Gm/3ml Soln (Lactulose) .... Take 1 tablespoon twice a day  Patient Instructions: 1)  it was good to see you today. 2)  no samples levitra  or other medications for ED available today 3)  refill on all medications as discussed 4)  Please schedule a follow-up appointment in 4 months to review mdications and blood pressure, call sooner if problems.  Prescriptions: TAMSULOSIN HCL 0.4 MG CAPS (TAMSULOSIN HCL) at bedtime  #30 x 5   Entered by:   Orlan Leavens RMA   Authorized by:   Newt Lukes MD   Signed by:   Orlan Leavens RMA on 08/22/2010   Method used:   Electronically to        [facility] [phone]* (retail)       [address]       [place], [place]  [address]       Ph: [phone]       Fax: [phone]   RxID:   [id number] LEVITRA 10 MG TABS (VARDENAFIL HCL) 1 by mouth as needed for sexual function  #3 x 5   Entered by:   Orlan Leavens RMA   Authorized by:   Newt Lukes MD   Signed by:   Orlan Leavens RMA on 08/22/2010   Method used:   Electronically to        [facility]. [phone]* (retail)       [address]       [place],  [place]  [address]       Ph: [phone]       Fax: [phone]   RxID:   [id number] TOPIRAMATE 100 MG TABS (TOPIRAMATE) take 1 at bedtime  #30 x 5   Entered by:   Orlan Leavens RMA   Authorized by:   Newt Lukes MD   Signed by:   Orlan Leavens RMA on 08/22/2010   Method used:   Electronically to        [facility]. [phone]* (retail)       [address]       [place], [place]  [address]       Ph: [phone]       Fax: [phone]   RxID:   [id number] METOPROLOL TARTRATE 25 MG TABS (METOPROLOL TARTRATE) take 1 two times a day  #60 x 5   Entered by:   Orlan Leavens RMA   Authorized by:   Newt Lukes MD   Signed by:   Orlan Leavens RMA on 08/22/2010   Method used:   Electronically to        [facility]. [phone]* (retail)       [address]       [place], [place]  [address]       Ph: [phone]       Fax: [phone]   RxID:   [id number] OMEPRAZOLE 20 MG CPDR (OMEPRAZOLE) take 1 by mouth qd  #30 x 5   Entered by:   Orlan Leavens RMA   Authorized by:   Newt Lukes MD  Signed by:   Orlan Leavens RMA on 08/22/2010   Method used:   Electronically to        [facility] [phone]* (retail)       [address]       [place], [place]  [address]       Ph: [phone]       Fax: [phone]   RxID:   [id number]    Orders Added: 1)  Est. Patient Level IV [[address]]

## 2010-11-29 ENCOUNTER — Encounter: Payer: Self-pay | Admitting: *Deleted

## 2010-12-20 ENCOUNTER — Ambulatory Visit: Payer: Self-pay | Admitting: Internal Medicine

## 2010-12-20 DIAGNOSIS — Z0289 Encounter for other administrative examinations: Secondary | ICD-10-CM
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
## 2011-02-19 ENCOUNTER — Ambulatory Visit: Payer: Self-pay | Admitting: Internal Medicine

## 2011-02-19 DIAGNOSIS — Z0289 Encounter for other administrative examinations: Secondary | ICD-10-CM

## 2011-03-10 ENCOUNTER — Other Ambulatory Visit: Payer: Self-pay | Admitting: Internal Medicine

## 2011-06-08 LAB — CARDIAC PANEL(CRET KIN+CKTOT+MB+TROPI)
CK, MB: 2.9
Relative Index: 1.8
Total CK: 146
Total CK: 208
Troponin I: <0.01

## 2011-06-08 LAB — POCT I-STAT, CHEM 8
Calcium, Ion: 1.06 — ABNORMAL LOW
Creatinine, Ser: 1.4
Glucose, Bld: 103 — ABNORMAL HIGH
Hemoglobin: 14.3
TCO2: 21

## 2011-06-08 LAB — COMPREHENSIVE METABOLIC PANEL
ALT: 21
AST: 20
CO2: 24
Calcium: 8.8
Chloride: 111
GFR calc Af Amer: >60
GFR calc non Af Amer: 50 — ABNORMAL LOW
Potassium: 3.1 — ABNORMAL LOW
Sodium: 143

## 2011-06-08 LAB — URINALYSIS, ROUTINE W REFLEX MICROSCOPIC
Hgb urine dipstick: NEGATIVE
Specific Gravity, Urine: 1.017
Urobilinogen, UA: 1
pH: 7

## 2011-06-08 LAB — CBC
HCT: 41.8
MCHC: 34.8
MCV: 94.9
RBC: 4.4
WBC: 5.4

## 2011-06-08 LAB — DIFFERENTIAL
Eosinophils Absolute: 0.1
Eosinophils Relative: 1
Lymphs Abs: 1.6
Monocytes Absolute: 0.4
Monocytes Relative: 7

## 2011-06-08 LAB — LIPID PANEL
HDL: 49
Triglycerides: 49
VLDL: 10

## 2011-06-08 LAB — POCT CARDIAC MARKERS: CKMB, poc: 1.1

## 2011-08-09 ENCOUNTER — Other Ambulatory Visit: Payer: Self-pay | Admitting: Internal Medicine

## 2011-10-12 HISTORY — PX: COLONOSCOPY, ESOPHAGOGASTRODUODENOSCOPY (EGD) AND ESOPHAGEAL DILATION: SHX5781

## 2011-11-03 ENCOUNTER — Inpatient Hospital Stay (HOSPITAL_COMMUNITY)
Admission: EM | Admit: 2011-11-03 | Discharge: 2011-11-09 | DRG: 064 | Disposition: A | Discharge status: Home Health | Payer: Medicare Other | Attending: Internal Medicine | Admitting: Internal Medicine

## 2011-11-03 ENCOUNTER — Encounter (HOSPITAL_COMMUNITY): Payer: Self-pay

## 2011-11-03 ENCOUNTER — Other Ambulatory Visit: Payer: Self-pay

## 2011-11-03 ENCOUNTER — Inpatient Hospital Stay (HOSPITAL_COMMUNITY): Payer: Medicare Other

## 2011-11-03 ENCOUNTER — Emergency Department (HOSPITAL_COMMUNITY): Payer: Medicare Other

## 2011-11-03 DIAGNOSIS — R531 Weakness: Secondary | ICD-10-CM

## 2011-11-03 DIAGNOSIS — Z8546 Personal history of malignant neoplasm of prostate: Secondary | ICD-10-CM

## 2011-11-03 DIAGNOSIS — Y842 Radiological procedure and radiotherapy as the cause of abnormal reaction of the patient, or of later complication, without mention of misadventure at the time of the procedure: Secondary | ICD-10-CM | POA: Diagnosis present

## 2011-11-03 DIAGNOSIS — Z8249 Family history of ischemic heart disease and other diseases of the circulatory system: Secondary | ICD-10-CM

## 2011-11-03 DIAGNOSIS — I6350 Cerebral infarction due to unspecified occlusion or stenosis of unspecified cerebral artery: Principal | ICD-10-CM | POA: Diagnosis present

## 2011-11-03 DIAGNOSIS — C61 Malignant neoplasm of prostate: Secondary | ICD-10-CM

## 2011-11-03 DIAGNOSIS — Z860100 Personal history of colon polyps, unspecified: Secondary | ICD-10-CM

## 2011-11-03 DIAGNOSIS — Z87891 Personal history of nicotine dependence: Secondary | ICD-10-CM

## 2011-11-03 DIAGNOSIS — E876 Hypokalemia: Secondary | ICD-10-CM | POA: Diagnosis present

## 2011-11-03 DIAGNOSIS — R0989 Other specified symptoms and signs involving the circulatory and respiratory systems: Secondary | ICD-10-CM

## 2011-11-03 DIAGNOSIS — K627 Radiation proctitis: Secondary | ICD-10-CM | POA: Diagnosis present

## 2011-11-03 DIAGNOSIS — N189 Chronic kidney disease, unspecified: Secondary | ICD-10-CM | POA: Diagnosis present

## 2011-11-03 DIAGNOSIS — K219 Gastro-esophageal reflux disease without esophagitis: Secondary | ICD-10-CM | POA: Diagnosis present

## 2011-11-03 DIAGNOSIS — M6282 Rhabdomyolysis: Secondary | ICD-10-CM

## 2011-11-03 DIAGNOSIS — R1319 Other dysphagia: Secondary | ICD-10-CM | POA: Diagnosis present

## 2011-11-03 DIAGNOSIS — K922 Gastrointestinal hemorrhage, unspecified: Secondary | ICD-10-CM | POA: Diagnosis present

## 2011-11-03 DIAGNOSIS — K6289 Other specified diseases of anus and rectum: Secondary | ICD-10-CM | POA: Diagnosis present

## 2011-11-03 DIAGNOSIS — Z803 Family history of malignant neoplasm of breast: Secondary | ICD-10-CM

## 2011-11-03 DIAGNOSIS — K5731 Diverticulosis of large intestine without perforation or abscess with bleeding: Secondary | ICD-10-CM | POA: Diagnosis present

## 2011-11-03 DIAGNOSIS — W19XXXA Unspecified fall, initial encounter: Secondary | ICD-10-CM

## 2011-11-03 DIAGNOSIS — Z85038 Personal history of other malignant neoplasm of large intestine: Secondary | ICD-10-CM

## 2011-11-03 DIAGNOSIS — E785 Hyperlipidemia, unspecified: Secondary | ICD-10-CM | POA: Diagnosis present

## 2011-11-03 DIAGNOSIS — I129 Hypertensive chronic kidney disease with stage 1 through stage 4 chronic kidney disease, or unspecified chronic kidney disease: Secondary | ICD-10-CM | POA: Diagnosis present

## 2011-11-03 DIAGNOSIS — R1310 Dysphagia, unspecified: Secondary | ICD-10-CM | POA: Diagnosis present

## 2011-11-03 DIAGNOSIS — Z8673 Personal history of transient ischemic attack (TIA), and cerebral infarction without residual deficits: Secondary | ICD-10-CM

## 2011-11-03 HISTORY — DX: Atherosclerotic heart disease of native coronary artery without angina pectoris: I25.10

## 2011-11-03 HISTORY — DX: Acute myocardial infarction, unspecified: I21.9

## 2011-11-03 HISTORY — DX: Shortness of breath: R06.02

## 2011-11-03 HISTORY — DX: Cerebral infarction, unspecified: I63.9

## 2011-11-03 LAB — CBC
HCT: 37.9 % — ABNORMAL LOW (ref 39.0–52.0)
HCT: 35.9 % — ABNORMAL LOW (ref 39.0–52.0)
Hemoglobin: 13.5 g/dL (ref 13.0–17.0)
Hemoglobin: 12.9 g/dL — ABNORMAL LOW (ref 13.0–17.0)
MCH: 33.2 pg (ref 26.0–34.0)
MCHC: 35.6 g/dL (ref 30.0–36.0)
MCV: 93.5 fL (ref 78.0–100.0)
RBC: 3.84 MIL/uL — ABNORMAL LOW (ref 4.22–5.81)
WBC: 6.1 10*3/uL (ref 4.0–10.5)

## 2011-11-03 LAB — DIFFERENTIAL
Basophils Relative: 0 % (ref 0–1)
Eosinophils Absolute: 0 10*3/uL (ref 0.0–0.7)
Eosinophils Relative: 0 % (ref 0–5)
Monocytes Absolute: 0.6 10*3/uL (ref 0.1–1.0)
Monocytes Relative: 7 % (ref 3–12)

## 2011-11-03 LAB — CREATININE, SERUM
GFR calc Af Amer: 84 mL/min — ABNORMAL LOW (ref >90)
GFR calc non Af Amer: 73 mL/min — ABNORMAL LOW (ref >90)

## 2011-11-03 LAB — CARDIAC PANEL(CRET KIN+CKTOT+MB+TROPI)
CK, MB: 35.5 ng/mL (ref 0.3–4.0)
Relative Index: 3.5 — ABNORMAL HIGH (ref 0.0–2.5)
Total CK: 1025 U/L — ABNORMAL HIGH (ref 7–232)

## 2011-11-03 LAB — COMPREHENSIVE METABOLIC PANEL
Albumin: 3.6 g/dL (ref 3.5–5.2)
BUN: 12 mg/dL (ref 6–23)
Creatinine, Ser: 0.98 mg/dL (ref 0.50–1.35)
Total Protein: 6.4 g/dL (ref 6.0–8.3)

## 2011-11-03 MED ORDER — SODIUM CHLORIDE 0.9 % IV BOLUS (SEPSIS)
1000.0000 mL | Freq: Once | INTRAVENOUS | Status: AC
  Administered 2011-11-03: 1000 mL via INTRAVENOUS

## 2011-11-03 MED ORDER — SODIUM CHLORIDE 0.9 % IV SOLN
INTRAVENOUS | Status: AC
  Administered 2011-11-03: 14:00:00 via INTRAVENOUS

## 2011-11-03 MED ORDER — ACETAMINOPHEN 325 MG PO TABS
650.0000 mg | ORAL_TABLET | ORAL | Status: DC | PRN
  Administered 2011-11-07: 650 mg via ORAL
  Filled 2011-11-03: qty 2

## 2011-11-03 MED ORDER — ASPIRIN EC 325 MG PO TBEC
325.0000 mg | DELAYED_RELEASE_TABLET | Freq: Every day | ORAL | Status: DC
  Administered 2011-11-05 – 2011-11-07 (×3): 325 mg via ORAL
  Filled 2011-11-03 (×3): qty 1

## 2011-11-03 MED ORDER — ACETAMINOPHEN 650 MG RE SUPP: 650.0000 mg | RECTAL | Status: DC | PRN

## 2011-11-03 MED ORDER — SENNOSIDES-DOCUSATE SODIUM 8.6-50 MG PO TABS
1.0000 | ORAL_TABLET | Freq: Every evening | ORAL | Status: DC | PRN
  Administered 2011-11-07: 1 via ORAL
  Filled 2011-11-03: qty 1

## 2011-11-03 MED ORDER — TOPIRAMATE 100 MG PO TABS
100.0000 mg | ORAL_TABLET | Freq: Every day | ORAL | Status: DC
  Administered 2011-11-04 – 2011-11-08 (×5): 100 mg via ORAL
  Filled 2011-11-03 (×8): qty 1

## 2011-11-03 MED ORDER — SODIUM CHLORIDE 0.9 % IV SOLN
INTRAVENOUS | Status: DC
  Administered 2011-11-03 – 2011-11-04 (×3): via INTRAVENOUS

## 2011-11-03 MED ORDER — LACTULOSE 10 GM/15ML PO SOLN
20.0000 g | Freq: Two times a day (BID) | ORAL | Status: DC
  Administered 2011-11-04 – 2011-11-05 (×2): 20 g via ORAL
  Filled 2011-11-03 (×9): qty 30

## 2011-11-03 MED ORDER — ONDANSETRON HCL 4 MG/2ML IJ SOLN: 4.0000 mg | Freq: Four times a day (QID) | INTRAMUSCULAR | Status: DC | PRN

## 2011-11-03 MED ORDER — SODIUM CHLORIDE 0.9 % IJ SOLN: 3.0000 mL | INTRAMUSCULAR | Status: DC | PRN

## 2011-11-03 MED ORDER — SODIUM CHLORIDE 0.9 % IJ SOLN
3.0000 mL | Freq: Two times a day (BID) | INTRAMUSCULAR | Status: DC
  Administered 2011-11-04 – 2011-11-05 (×2): 3 mL via INTRAVENOUS

## 2011-11-03 MED ORDER — TAMSULOSIN HCL 0.4 MG PO CAPS
0.4000 mg | ORAL_CAPSULE | Freq: Every day | ORAL | Status: DC
  Administered 2011-11-04 – 2011-11-08 (×5): 0.4 mg via ORAL
  Filled 2011-11-03 (×8): qty 1

## 2011-11-03 MED ORDER — LORAZEPAM 2 MG/ML IJ SOLN
1.0000 mg | Freq: Once | INTRAMUSCULAR | Status: AC
  Administered 2011-11-04: 11:00:00 via INTRAVENOUS
  Filled 2011-11-03: qty 1

## 2011-11-03 MED ORDER — ENOXAPARIN SODIUM 40 MG/0.4ML ~~LOC~~ SOLN
40.0000 mg | Freq: Every day | SUBCUTANEOUS | Status: DC
  Administered 2011-11-03 – 2011-11-06 (×4): 40 mg via SUBCUTANEOUS
  Filled 2011-11-03 (×5): qty 0.4

## 2011-11-03 MED ORDER — POTASSIUM CHLORIDE CRYS ER 20 MEQ PO TBCR
40.0000 meq | EXTENDED_RELEASE_TABLET | Freq: Two times a day (BID) | ORAL | Status: AC
  Filled 2011-11-03 (×2): qty 2

## 2011-11-03 MED ORDER — SODIUM CHLORIDE 0.9 % IV SOLN: 250.0000 mL | INTRAVENOUS | Status: DC | PRN

## 2011-11-03 MED ORDER — PANTOPRAZOLE SODIUM 40 MG PO TBEC
40.0000 mg | DELAYED_RELEASE_TABLET | Freq: Every day | ORAL | Status: DC
  Administered 2011-11-05 – 2011-11-07 (×3): 40 mg via ORAL
  Filled 2011-11-03 (×3): qty 1

## 2011-11-03 NOTE — Progress Notes (Signed)
VASCULAR LAB PRELIMINARY  PRELIMINARY  PRELIMINARY  PRELIMINARY  Carotid Dopplers completed.    Preliminary report:  No ICA stenosis.  Vertebral artery flow is antegrade.  Sherren Kerns [place], 11/03/2011, 6:52 PM
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
## 2011-11-03 NOTE — ED Provider Notes (Addendum)
History     CSN: [id number]  Arrival date & time 11/03/11  1151   First MD Initiated Contact with Patient 11/03/11 1215      Chief Complaint  Patient presents with  . Fall    (Consider location/radiation/quality/duration/timing/severity/associated sxs/prior treatment) HPI Comments: "I think I may have had a stroke".  Patient states that he fell last night and was unable to get up.  He remained on the floor all night until his neighbor found him.  He says that he fell because "his legs gave out on him and got real weak".  Patient is a 66 y.o. male presenting with fall. The history is provided by the patient.  Fall The accident occurred yesterday. The fall occurred while walking. He landed on a hard floor. There was no blood loss. The pain is moderate. He was not ambulatory at the scene. He has tried nothing for the symptoms.    Past Medical History  Diagnosis Date  . 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 DYSLIPIDEMIA 08/26/2009  . HYPERTENSION 08/29/2009  . GERD 08/29/2009  . RENAL DISEASE, CHRONIC 08/26/2009  . 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 CARCINOMA, COLON, HX OF 06/14/2010  . CEREBROVASCULAR ACCIDENT, HX OF 08/29/2009  . Personal history of colonic polyps 07/19/2010  . TOBACCO USE, QUIT 08/29/2009  . Stroke     Past Surgical History  Procedure Date  . Liver biopsy   . Colon surgery     Family History  Problem Relation Age of Onset  . Breast cancer Mother   . Heart disease Father   . Clotting disorder Maternal Uncle     History  Substance Use Topics  . Smoking status: Former Smoker    Types: Cigarettes  . Smokeless tobacco: Not on file   Comment: Widowed, lives alone-but has lady friend. enjoys spending time with his 7 g-kids  . Alcohol Use: Yes      Review of Systems  Constitutional: Positive for fatigue.  All other systems reviewed and are negative.    Allergies  Review of patient's allergies indicates no known allergies.  Home  Medications   Current Outpatient Rx  Name Route Sig Dispense Refill  . ASPIRIN 325 MG PO TBEC Oral Take 325 mg by mouth daily.      Marland Kitchen LACTULOSE 10 GM/15ML PO SOLN Oral Take 20 g by mouth 2 (two) times daily.      Marland Kitchen METOPROLOL TARTRATE 25 MG PO TABS Oral Take 25 mg by mouth 2 (two) times daily.    Marland Kitchen OMEPRAZOLE 20 MG PO CPDR Oral Take 20 mg by mouth daily.    Marland Kitchen TAMSULOSIN HCL 0.4 MG PO CAPS Oral Take 0.4 mg by mouth at bedtime.    . TOPIRAMATE 100 MG PO TABS Oral Take 100 mg by mouth at bedtime.    Marland Kitchen VARDENAFIL HCL 10 MG PO TABS Oral Take 10 mg by mouth daily as needed. For E.D.      BP 133/75  Pulse 80  Temp 97.9 F (36.6 C)  Resp 20  Ht 5\' 8"  (1.727 m)  Wt 155 lb (70.308 kg)  BMI 23.57 kg/m2  SpO2 99%  Physical Exam  Nursing note and vitals reviewed. Constitutional: He is oriented to person, place, and time.       Elderly male, appears older than stated age.  HENT:  Head: Normocephalic and atraumatic.  Eyes: EOM are normal. Pupils are equal, round, and reactive to light.  Neck: Normal range of motion. Neck supple.  Cardiovascular:  Normal rate and regular rhythm.  Exam reveals no friction rub.   No murmur heard. Pulmonary/Chest: Effort normal and breath sounds normal. No respiratory distress.  Abdominal: Soft. Bowel sounds are normal. He exhibits no distension. There is no tenderness.  Musculoskeletal: Normal range of motion. He exhibits no edema.  Neurological: He is alert and oriented to person, place, and time. No cranial nerve deficit. Coordination normal.  Skin: Skin is warm and dry.    ED Course  Procedures (including critical care time)   Labs Reviewed  CBC  DIFFERENTIAL  COMPREHENSIVE METABOLIC PANEL  CARDIAC PANEL(CRET KIN+CKTOT+MB+TROPI)   No results found.   No diagnosis found.   Date: 11/03/2011  Rate: 70  Rhythm: normal sinus rhythm  QRS Axis: normal  Intervals: normal  ST/T Wave abnormalities: normal  Conduction Disutrbances:none  Narrative  Interpretation:   Old EKG Reviewed: unchanged    MDM  Labs show an elevated ck of 1025 suggestive of rhabdomyolysis.  He spent the night on the floor after falling and being unable to get up.  He feels generally weaker than normal but nothing focal on exam.  I will consult internal medicine for admission.        Geoffery Lyons, MD 11/03/11 1409  Geoffery Lyons, MD 11/17/11 [phone]

## 2011-11-03 NOTE — H&P (Signed)
History and Physical  Terry Villa WUJ:[id number] DOB: 08/30/1946 DOA: 11/03/2011  Referring physician: Geoffery Lyons, MD PCP: Rene Paci, MD, MD   Chief Complaint: Weakness  HPI:  66 year old and presents the emergency department with a history of a fall last night. Patient was in his usual state of health. He is walking with a cane as he usually does when he suddenly became generally weak in both legs and fell to the floor. No loss of consciousness. No head injury. He laid on the floor all night unable to get up because of generalized weakness.  He presented to the emergency department today and was found to have mild rhabdomyolysis and was referred for admission. CT of the head was unremarkable. The patient reports no focal neuro deficits at this point. He does have chronic right upper and right lower extremity weakness which he reports as being unchanged. He also has mild slurred speech which he again reports is chronic.  Review of Systems:  No fever, changes to his vision, sore throat, rash, muscle aches, chest pain, shortness of breath, dysuria, bleeding, nausea, vomiting, abdominal pain, diarrhea  Past Medical History  Diagnosis Date  . 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 DYSLIPIDEMIA 08/26/2009  . HYPERTENSION 08/29/2009  . GERD 08/29/2009  . RENAL DISEASE, CHRONIC 08/26/2009  . 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 CARCINOMA, COLON, HX OF 06/14/2010  . CEREBROVASCULAR ACCIDENT, HX OF 08/29/2009  . Personal history of colonic polyps 07/19/2010  . TOBACCO USE, QUIT 08/29/2009  . Stroke    Past Surgical History  Procedure Date  . Liver biopsy   . Colon surgery    Social History:  reports that he has quit smoking. His smoking use included Cigarettes. He does not have any smokeless tobacco history on file. He reports that he drinks alcohol. He reports that he does not use illicit drugs.  No Known Allergies  Family History  Problem Relation Age of Onset  . Breast  cancer Mother   . Heart disease Father   . Clotting disorder Maternal Uncle     Prior to Admission medications   Medication Sig Start Date End Date Taking? Authorizing Provider  aspirin 325 MG EC tablet Take 325 mg by mouth daily.     Yes Historical Provider, MD  lactulose (CHRONULAC) 10 GM/15ML solution Take 20 g by mouth 2 (two) times daily.     Yes Historical Provider, MD  metoprolol tartrate (LOPRESSOR) 25 MG tablet Take 25 mg by mouth 2 (two) times daily.   Yes Historical Provider, MD  omeprazole (PRILOSEC) 20 MG capsule Take 20 mg by mouth daily.   Yes Historical Provider, MD  Tamsulosin HCl (FLOMAX) 0.4 MG CAPS Take 0.4 mg by mouth at bedtime.   Yes Historical Provider, MD  topiramate (TOPAMAX) 100 MG tablet Take 100 mg by mouth at bedtime.   Yes Historical Provider, MD  vardenafil (LEVITRA) 10 MG tablet Take 10 mg by mouth daily as needed. For E.D.   Yes Historical Provider, MD   Physical Exam: Filed Vitals:   11/03/11 1155 11/03/11 1210 11/03/11 1330  BP:  133/75 136/77  Pulse:  80 79  Temp:  97.9 F (36.6 C)   Resp:  20 16  Height: 5\' 8"  (1.727 m)    Weight: 70.308 kg (155 lb)    SpO2:  99% 99%     General:  Appears calm and comfortable.  Eyes: Pupils equal, round, reactive to light. Normal lids, irises.  ENT: Mildly hard of hearing. Normal lips  and tongue.  Neck: No lymphadenopathy or masses. No thyromegaly.  Cardiovascular: Regular rate and rhythm. No murmur, rub, gallop. No lower extremity edema.  Respiratory: Clear to auscultation bilaterally. No wheezes, rales, rhonchi. Normal respiratory effort.  Abdomen: Soft, nontender, nondistended.  Skin: Grossly unremarkable.  Musculoskeletal: Left upper and left lower extremity tone is 5/5. Right upper and right lower extremity tone is 4 minus/5. Face appears symmetric.  Psychiatric: Grossly normal mood and affect. Speech is slightly difficult to understand.  Neurologic: Cranial nerves 2-12 appear to be  intact.  Labs on Admission:  Basic Metabolic Panel:  Lab 11/03/11 4540  NA 134*  K 3.4*  CL 102  CO2 19  GLUCOSE 102*  BUN 12  CREATININE 0.98  CALCIUM 9.5  MG --  PHOS --   Liver Function Tests:  Lab 11/03/11 1245  AST 39*  ALT 23  ALKPHOS 83  BILITOT 0.6  PROT 6.4  ALBUMIN 3.6   CBC:  Lab 11/03/11 1245  WBC 8.4  NEUTROABS 7.3  HGB 13.5  HCT 37.9*  MCV 93.1  PLT 234   Cardiac Enzymes:  Lab 11/03/11 1245  CKTOTAL 1025*  CKMB 35.5*  CKMBINDEX --  TROPONINI <0.30   Radiological Exams on Admission: Ct Head Wo Contrast  11/03/2011  *RADIOLOGY REPORT*  Clinical Data: Generalized weakness.  Fell.  History of stroke. History of prostate cancer.  CT HEAD WITHOUT CONTRAST 11/03/2011:  Technique:  Contiguous axial images were obtained from the base of the skull through the vertex without contrast.  Comparison: Unenhanced cranial CT 03/24/2008 [facility].  Findings: Encephalomalacia involving the left temporal and parietal lobes, consistent with an old left middle cerebral artery distribution stroke, unchanged.  Mild cortical atrophy and moderate deep atrophy, unchanged.  Mild changes of small vessel disease of the white matter, unchanged.  No mass lesion.  No midline shift. No acute hemorrhage or hematoma.  No extra-axial fluid collections. No evidence of acute infarction.  No significant interval change.  No focal osseous abnormality involving the skull.  Visualized paranasal sinuses, mastoid air cells, and middle ear cavities well- aerated.  Bilateral carotid siphon atherosclerosis.  IMPRESSION:  1.  No acute intracranial abnormality. 2.  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 3.  Stable mild to moderate generalized atrophy and mild chronic microvascular ischemic changes of the white matter.  Original Report Authenticated By: Arnell Sieving, M.D.   EKG: Independently reviewed. 1300: Normal sinus rhythm. No acute changes. 1303: Normal sinus  rhythm. No acute changes.  Assessment/Plan 1. Sudden fall: By description nonmechanical. However no syncope, no loss of consciousness. No focal motor deficits. By description doubt cardiac. 2. Possible TIA/CVA: No focal deficits on exam but history is not entirely clear. Patient feels like he had a stroke and there is not a good explanation for his fall. Stroke evaluation. For now continue aspirin. 3. Rhabdomyolysis: Secondary to lying on the floor last night without being able to get up. IV fluids. Repeat laboratory studies in the morning. 4. Hypokalemia: Replete.  Note that the patient would not be a candidate for tPA regardless given delay in the patient seeking medical care.   Code Status: Full code Family Communication:  Disposition Plan: Pending further evaluation and treatment.  Brendia Sacks, MD  [facility] Pager [phone] 11/03/2011, 2:01 PM

## 2011-11-03 NOTE — ED Notes (Signed)
Dr. Judd Lien was made aware of patient's elevated CPK total and CKMB

## 2011-11-03 NOTE — ED Notes (Signed)
Admitting MD at bedside.

## 2011-11-03 NOTE — ED Notes (Signed)
Pt was brought in by EMS with c/o fall. Pt fell last night, he said his legs gave out on him and fell. Pt denies any pain at present

## 2011-11-04 ENCOUNTER — Inpatient Hospital Stay (HOSPITAL_COMMUNITY): Payer: Medicare Other

## 2011-11-04 LAB — CBC
HCT: 33.7 % — ABNORMAL LOW (ref 39.0–52.0)
Hemoglobin: 12 g/dL — ABNORMAL LOW (ref 13.0–17.0)
MCHC: 35.6 g/dL (ref 30.0–36.0)
WBC: 4.5 10*3/uL (ref 4.0–10.5)

## 2011-11-04 LAB — BASIC METABOLIC PANEL
BUN: 11 mg/dL (ref 6–23)
Chloride: 112 mEq/L (ref 96–112)
GFR calc Af Amer: 72 mL/min — ABNORMAL LOW (ref >90)
GFR calc non Af Amer: 63 mL/min — ABNORMAL LOW (ref >90)
Glucose, Bld: 63 mg/dL — ABNORMAL LOW (ref 70–99)
Potassium: 3.2 mEq/L — ABNORMAL LOW (ref 3.5–5.1)
Sodium: 142 mEq/L (ref 135–145)

## 2011-11-04 LAB — HEMOGLOBIN A1C
Hgb A1c MFr Bld: 5.5 % (ref <5.7)
Mean Plasma Glucose: 111 mg/dL (ref <117)

## 2011-11-04 LAB — CK TOTAL AND CKMB (NOT AT ARMC)
CK, MB: 15.9 ng/mL (ref 0.3–4.0)
Total CK: 538 U/L — ABNORMAL HIGH (ref 7–232)

## 2011-11-04 LAB — LIPID PANEL
LDL Cholesterol: 58 mg/dL (ref 0–99)
VLDL: 12 mg/dL (ref 0–40)

## 2011-11-04 MED ORDER — POTASSIUM CHLORIDE 10 MEQ/100ML IV SOLN
10.0000 meq | INTRAVENOUS | Status: AC
  Administered 2011-11-04 (×6): 10 meq via INTRAVENOUS
  Filled 2011-11-04 (×6): qty 100

## 2011-11-04 NOTE — Progress Notes (Signed)
  Echocardiogram 2D Echocardiogram has been performed.  Terry Villa Terry Villa 11/04/2011, 11:51 AM

## 2011-11-04 NOTE — Progress Notes (Signed)
Terry Villa,PT Acute Rehabilitation [phone] (pager)  

## 2011-11-04 NOTE — Progress Notes (Addendum)
PT Cancellation Note  Treatment cancelled today due to patient's refusal to participate. Pt had just taken Ativan and was unable to arouse.   Elvera Bicker 11/04/2011, 12:39 PM

## 2011-11-04 NOTE — Progress Notes (Signed)
Patient ID: Terry Villa, male   DOB: 1946-05-27, 66 y.o.   MRN: [id number]  Subjective: No events overnight. Patient denies chest pain, shortness of breath, abdominal pain.   Objective:  Vital signs in last 24 hours:  Filed Vitals:   11/04/11 0200 11/04/11 0400 11/04/11 0800 11/04/11 1337  BP: 106/50 117/62 120/68 152/81  Pulse: 68 63 72 67  Temp:  98.3 F (36.8 C) 98.1 F (36.7 C) 98 F (36.7 C)  TempSrc:  Oral    Resp: 18 18 20 20   Height:      Weight:      SpO2:  98% 97% 98%    Intake/Output from previous day:   Intake/Output Summary (Last 24 hours) at 11/04/11 2114 Last data filed at 11/04/11 0100  Gross per 24 hour  Intake      0 ml  Output    350 ml  Net   -350 ml    Physical Exam: General: Alert, awake, oriented x3, in no acute distress. HEENT: No bruits, no goiter. Moist mucous membranes, no scleral icterus, no conjunctival pallor. Heart: Regular rate and rhythm, S1/S2 +, no murmurs, rubs, gallops. Lungs: Clear to auscultation bilaterally. No wheezing, no rhonchi, no rales.  Abdomen: Soft, nontender, nondistended, positive bowel sounds. Extremities: No clubbing or cyanosis, no pitting edema,  positive pedal pulses. Neuro: Grossly nonfocal.  Lab Results:  Basic Metabolic Panel:    Component Value Date/Time   NA 142 11/04/2011 0940   K 3.2* 11/04/2011 0940   CL 112 11/04/2011 0940   CO2 19 11/04/2011 0940   BUN 11 11/04/2011 0940   CREATININE 1.18 11/04/2011 0940   GLUCOSE 63* 11/04/2011 0940   CALCIUM 8.6 11/04/2011 0940   CBC:    Component Value Date/Time   WBC 4.5 11/04/2011 0940   WBC 5.4 01/09/2010 1126   HGB 12.0* 11/04/2011 0940   HGB 13.9 01/09/2010 1126   HCT 33.7* 11/04/2011 0940   HCT 40.4 01/09/2010 1126   PLT 227 11/04/2011 0940   PLT 229 01/09/2010 1126   MCV 95.7 11/04/2011 0940   MCV 97.5 01/09/2010 1126   NEUTROABS 7.3 11/03/2011 1245   NEUTROABS 3.8 01/09/2010 1126   LYMPHSABS 0.5* 11/03/2011 1245   LYMPHSABS 1.1 01/09/2010 1126   MONOABS 0.6  11/03/2011 1245   MONOABS 0.4 01/09/2010 1126   EOSABS 0.0 11/03/2011 1245   EOSABS 0.1 01/09/2010 1126   BASOSABS 0.0 11/03/2011 1245   BASOSABS 0.0 01/09/2010 1126      Lab 11/04/11 0940 11/03/11 1819 11/03/11 1245  WBC 4.5 6.1 8.4  HGB 12.0* 12.9* 13.5  HCT 33.7* 35.9* 37.9*  PLT 227 227 234  MCV 95.7 93.5 93.1  MCH 34.1* 33.6 33.2  MCHC 35.6 35.9 35.6  RDW 12.2 11.8 11.7  LYMPHSABS -- -- 0.5*  MONOABS -- -- 0.6  EOSABS -- -- 0.0  BASOSABS -- -- 0.0  BANDABS -- -- --    Lab 11/04/11 0940 11/03/11 1819 11/03/11 1245  NA 142 -- 134*  K 3.2* -- 3.4*  CL 112 -- 102  CO2 19 -- 19  GLUCOSE 63* -- 102*  BUN 11 -- 12  CREATININE 1.18 1.04 0.98  CALCIUM 8.6 -- 9.5  MG -- -- --   No results found for this basename: INR:5,PROTIME:5 in the last 168 hours Cardiac markers:  Lab 11/04/11 0505 11/03/11 1245  CKMB 15.9* 35.5*  TROPONINI -- <0.30  MYOGLOBIN -- --   No components found with this basename: POCBNP:3 No results  found for this or any previous visit (from the past 240 hour(s)).  Studies/Results: Ct Head Wo Contrast  11/03/2011  *RADIOLOGY REPORT*  Clinical Data: Generalized weakness.  Fell.  History of stroke. History of prostate cancer.  CT HEAD WITHOUT CONTRAST 11/03/2011:  Technique:  Contiguous axial images were obtained from the base of the skull through the vertex without contrast.  Comparison: Unenhanced cranial CT 03/24/2008 [facility].  Findings: Encephalomalacia involving the left temporal and parietal lobes, consistent with an old left middle cerebral artery distribution stroke, unchanged.  Mild cortical atrophy and moderate deep atrophy, unchanged.  Mild changes of small vessel disease of the white matter, unchanged.  No mass lesion.  No midline shift. No acute hemorrhage or hematoma.  No extra-axial fluid collections. No evidence of acute infarction.  No significant interval change.  No focal osseous abnormality involving the skull.  Visualized paranasal  sinuses, mastoid air cells, and middle ear cavities well- aerated.  Bilateral carotid siphon atherosclerosis.  IMPRESSION:  1.  No acute intracranial abnormality. 2.  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 3.  Stable mild to moderate generalized atrophy and mild chronic microvascular ischemic changes of the white matter.  Original Report Authenticated By: Arnell Sieving, M.D.    Medications: Scheduled Meds:   . sodium chloride   Intravenous STAT  . aspirin  325 mg Oral Daily  . enoxaparin  40 mg Subcutaneous QHS  . lactulose  20 g Oral BID  . LORazepam  1 mg Intravenous Once  . pantoprazole  40 mg Oral Q1200  . potassium chloride  10 mEq Intravenous Q1 Hr x 6  . potassium chloride  40 mEq Oral BID  . sodium chloride  3 mL Intravenous Q12H  . Tamsulosin HCl  0.4 mg Oral QHS  . topiramate  100 mg Oral QHS   Continuous Infusions:   . sodium chloride 125 mL/hr at 11/04/11 1922   PRN Meds:.sodium chloride, acetaminophen, acetaminophen, ondansetron (ZOFRAN) IV, senna-docusate, sodium chloride  Assessment/Plan:  1. Sudden fall: By description nonmechanical. However no syncope, no loss of consciousness. No focal motor deficits. By description doubt cardiac.Attempt MRI today, PT/OT/SLP evaluations pending 2. Possible TIA/CVA: No focal deficits on exam but history is not entirely clear. Patient feels like he had a stroke and there is not a good explanation for his fall. Stroke evaluation. For now continue aspirin. 3. Rhabdomyolysis: Secondary to lying on the floor last night without being able to get up. IV fluids. Repeat laboratory studies in the morning. 4. Hypokalemia: Replete    EDUCATION - test results and diagnostic studies were discussed with patient - patient verbalized the understanding - questions were answered at the bedside and contact information was provided for additional questions or concerns   LOS: 1 day   MAGICK-Asahel Risden 11/04/2011, 9:14  PM  [facility] Pager: [phone]

## 2011-11-04 NOTE — Evaluation (Signed)
Clinical/Bedside Swallow Evaluation Patient Details  Name: Terry Villa MRN: [id number] DOB: 09-06-1946 Today's Date: 11/04/2011  Past Medical History:  Past Medical History  Diagnosis Date  . 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 DYSLIPIDEMIA 08/26/2009  . HYPERTENSION 08/29/2009  . GERD 08/29/2009  . RENAL DISEASE, CHRONIC 08/26/2009  . 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 CARCINOMA, COLON, HX OF 06/14/2010  . Personal history of colonic polyps 07/19/2010  . TOBACCO USE, QUIT 08/29/2009  . Stroke     Residual right upper and right lower extremity weakness.  . Myocardial infarction   . Shortness of breath   . Coronary artery disease    Past Surgical History:  Past Surgical History  Procedure Date  . Liver biopsy   . Colon surgery    HPI:  66 y/o male admitted to ED s/p fall at home. Patient admitted with Rhabdomyolysis  with question of TIAvs. CVA. Patient failed RN swallow screen secondary to PMH of dysphagia. Patient on regular consistency diet with thin liquids prior to current admission.    Assessment/Recommendations/Treatment Plan    SLP Assessment Clinical Impression Statement: Patient presents with minimal delay in initiation of swallow. Oropharyngeal swallow functional with no observed s/s of aspiration noted.  Proceed with regular consistency diet with thin liquids with intermittent supervision.  ST to follow 1 to 2x for diet tolerance due to history of CVA and dysphagia .  Risk for Aspiration: Mild Other Related Risk Factors: History of dysphagia;History of GERD;Previous CVA  Swallow Evaluation Recommendations Solid Consistency: Regular Liquid Consistency: Thin Liquid Administration via: Cup;Straw Medication Administration: Whole meds with liquid Supervision: Intermittent supervision to cue for compensatory strategies Compensations: Slow rate;Small sips/bites Postural Changes and/or Swallow Maneuvers: Seated upright 90 degrees;Upright 30-60 min after  meal Oral Care Recommendations: Oral care BID Other Recommendations: Clarify dietary restrictions  Treatment Plan Speech Therapy Frequency: min 1 x/week Treatment Duration: 1 week Interventions: Diet toleration management by SLP;Patient/family education;Aspiration precaution training  Prognosis Prognosis for Safe Diet Advancement: Good  Individuals Consulted Consulted and Agree with Results and Recommendations: Patient;RN  Swallowing Goals  SLP Swallowing Goals Patient will consume recommended diet without observed clinical signs of aspiration with: Modified independent assistance Patient will utilize recommended strategies during swallow to increase swallowing safety with: Modified independent assistance   General  Date of Onset: 11/03/11 HPI: 66 y/o male admitted to ED s/p fall at home. Patient admitted with Rhabdomyolysis  with question of TIAvs. CVA. Patient failed RN swallow screen secondary to PMH of dysphagia. Patient on regular consistency diet with thin liquids prior to current admission.  Type of Study: Bedside swallow evaluation Diet Prior to this Study: NPO;IV Respiratory Status: Room air History of Intubation: No Behavior/Cognition: Lethargic Oral Cavity - Dentition: Dentures, top;Dentures, bottom Vision: Functional for self-feeding Patient Positioning: Upright in bed Baseline Vocal Quality: Clear Volitional Cough: Strong Volitional Swallow: Able to elicit  Oral Motor/Sensory Function  Overall Oral Motor/Sensory Function: Appears within functional limits for tasks assessed  Consistency Results  Ice Chips Ice chips: Within functional limits  Thin Liquid Thin Liquid: Within functional limits  Nectar Thick Liquid Nectar Thick Liquid: Within functional limits  Honey Thick Liquid Honey Thick Liquid: Within functional limits  Puree Puree: Within functional limits  Solid Solid: Within functional limits Moreen Fowler, M.S.,  CCC-SLP [phone]  [facility] 11/04/2011,4:52 PM

## 2011-11-05 ENCOUNTER — Encounter (HOSPITAL_COMMUNITY): Payer: Self-pay | Admitting: General Practice

## 2011-11-05 LAB — CARDIAC PANEL(CRET KIN+CKTOT+MB+TROPI)
CK, MB: 7.8 ng/mL (ref 0.3–4.0)
Relative Index: 2.8 — ABNORMAL HIGH (ref 0.0–2.5)
Troponin I: <0.3 ng/mL (ref <0.30)

## 2011-11-05 LAB — BASIC METABOLIC PANEL
BUN: 12 mg/dL (ref 6–23)
CO2: 20 mEq/L (ref 19–32)
Chloride: 110 mEq/L (ref 96–112)
Creatinine, Ser: 1.08 mg/dL (ref 0.50–1.35)
GFR calc Af Amer: 81 mL/min — ABNORMAL LOW (ref >90)
Potassium: 3.8 mEq/L (ref 3.5–5.1)

## 2011-11-05 LAB — CBC
HCT: 32.6 % — ABNORMAL LOW (ref 39.0–52.0)
Hemoglobin: 11.8 g/dL — ABNORMAL LOW (ref 13.0–17.0)
MCV: 95.6 fL (ref 78.0–100.0)
WBC: 5.4 10*3/uL (ref 4.0–10.5)

## 2011-11-05 MED ORDER — METOPROLOL TARTRATE 25 MG PO TABS
25.0000 mg | ORAL_TABLET | Freq: Two times a day (BID) | ORAL | Status: DC
  Administered 2011-11-05 – 2011-11-09 (×7): 25 mg via ORAL
  Filled 2011-11-05 (×13): qty 1

## 2011-11-05 NOTE — Progress Notes (Signed)
Patient ID: Terry Villa, male   DOB: 02-19-1946, 66 y.o.   MRN: [id number]  Subjective: No events overnight. Patient denies chest pain, shortness of breath, abdominal pain.   Objective:  Vital signs in last 24 hours:  Filed Vitals:   11/04/11 1337 11/04/11 2100 11/05/11 0100 11/05/11 0500  BP: 152/81 143/64 134/76 144/74  Pulse: 67 83 67 60  Temp: 98 F (36.7 C) 98.2 F (36.8 C)  98.5 F (36.9 C)  TempSrc:  Oral  Oral  Resp: 20 18  18   Height:      Weight:      SpO2: 98% 97%  97%    Intake/Output from previous day:   Intake/Output Summary (Last 24 hours) at 11/05/11 4098 Last data filed at 11/04/11 2100  Gross per 24 hour  Intake      0 ml  Output    300 ml  Net   -300 ml    Physical Exam: General: Alert, awake, oriented x3, in no acute distress. HEENT: No bruits, no goiter. Moist mucous membranes, no scleral icterus, no conjunctival pallor. Heart: Regular rate and rhythm, S1/S2 +, no murmurs, rubs, gallops. Lungs: Clear to auscultation bilaterally. No wheezing, no rhonchi, no rales.  Abdomen: Soft, nontender, nondistended, positive bowel sounds. Extremities: No clubbing or cyanosis, no pitting edema,  positive pedal pulses. Neuro: Grossly nonfocal. Gait not tested as patient refuses  Lab Results:  Basic Metabolic Panel:    Component Value Date/Time   NA 137 11/05/2011 0600   K 3.8 11/05/2011 0600   CL 110 11/05/2011 0600   CO2 20 11/05/2011 0600   BUN 12 11/05/2011 0600   CREATININE 1.08 11/05/2011 0600   GLUCOSE 98 11/05/2011 0600   CALCIUM 8.7 11/05/2011 0600   CBC:    Component Value Date/Time   WBC 5.4 11/05/2011 0600   WBC 5.4 01/09/2010 1126   HGB 11.8* 11/05/2011 0600   HGB 13.9 01/09/2010 1126   HCT 32.6* 11/05/2011 0600   HCT 40.4 01/09/2010 1126   PLT 209 11/05/2011 0600   PLT 229 01/09/2010 1126   MCV 95.6 11/05/2011 0600   MCV 97.5 01/09/2010 1126   NEUTROABS 7.3 11/03/2011 1245   NEUTROABS 3.8 01/09/2010 1126   LYMPHSABS 0.5* 11/03/2011 1245   LYMPHSABS 1.1  01/09/2010 1126   MONOABS 0.6 11/03/2011 1245   MONOABS 0.4 01/09/2010 1126   EOSABS 0.0 11/03/2011 1245   EOSABS 0.1 01/09/2010 1126   BASOSABS 0.0 11/03/2011 1245   BASOSABS 0.0 01/09/2010 1126      Lab 11/05/11 0600 11/04/11 0940 11/03/11 1819 11/03/11 1245  WBC 5.4 4.5 6.1 8.4  HGB 11.8* 12.0* 12.9* 13.5  HCT 32.6* 33.7* 35.9* 37.9*  PLT 209 227 227 234  MCV 95.6 95.7 93.5 93.1  MCH 34.6* 34.1* 33.6 33.2  MCHC 36.2* 35.6 35.9 35.6  RDW 12.2 12.2 11.8 11.7  LYMPHSABS -- -- -- 0.5*  MONOABS -- -- -- 0.6  EOSABS -- -- -- 0.0  BASOSABS -- -- -- 0.0  BANDABS -- -- -- --    Lab 11/05/11 0600 11/04/11 0940 11/03/11 1819 11/03/11 1245  NA 137 142 -- 134*  K 3.8 3.2* -- 3.4*  CL 110 112 -- 102  CO2 20 19 -- 19  GLUCOSE 98 63* -- 102*  BUN 12 11 -- 12  CREATININE 1.08 1.18 1.04 0.98  CALCIUM 8.7 8.6 -- 9.5  MG -- -- -- --   No results found for this basename: INR:5,PROTIME:5 in the last 168  hours Cardiac markers:  Lab 11/05/11 0600 11/04/11 0505 11/03/11 1245  CKMB 7.8* 15.9* 35.5*  TROPONINI <0.30 -- <0.30  MYOGLOBIN -- -- --   No components found with this basename: POCBNP:3 No results found for this or any previous visit (from the past 240 hour(s)).  Studies/Results: Ct Head Wo Contrast  11/03/2011  *RADIOLOGY REPORT*  Clinical Data: Generalized weakness.  Fell.  History of stroke. History of prostate cancer.  CT HEAD WITHOUT CONTRAST 11/03/2011:  Technique:  Contiguous axial images were obtained from the base of the skull through the vertex without contrast.  Comparison: Unenhanced cranial CT 03/24/2008 [facility].  Findings: Encephalomalacia involving the left temporal and parietal lobes, consistent with an old left middle cerebral artery distribution stroke, unchanged.  Mild cortical atrophy and moderate deep atrophy, unchanged.  Mild changes of small vessel disease of the white matter, unchanged.  No mass lesion.  No midline shift. No acute hemorrhage or hematoma.  No  extra-axial fluid collections. No evidence of acute infarction.  No significant interval change.  No focal osseous abnormality involving the skull.  Visualized paranasal sinuses, mastoid air cells, and middle ear cavities well- aerated.  Bilateral carotid siphon atherosclerosis.  IMPRESSION:  1.  No acute intracranial abnormality. 2.  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 3.  Stable mild to moderate generalized atrophy and mild chronic microvascular ischemic changes of the white matter.  Original Report Authenticated By: Arnell Sieving, M.D.    Medications: Scheduled Meds:   . aspirin  325 mg Oral Daily  . enoxaparin  40 mg Subcutaneous QHS  . lactulose  20 g Oral BID  . LORazepam  1 mg Intravenous Once  . pantoprazole  40 mg Oral Q1200  . potassium chloride  10 mEq Intravenous Q1 Hr x 6  . potassium chloride  40 mEq Oral BID  . sodium chloride  3 mL Intravenous Q12H  . Tamsulosin HCl  0.4 mg Oral QHS  . topiramate  100 mg Oral QHS   Continuous Infusions:   . DISCONTD: sodium chloride 125 mL/hr at 11/04/11 1922   PRN Meds:.sodium chloride, acetaminophen, acetaminophen, ondansetron (ZOFRAN) IV, senna-docusate, sodium chloride  Assessment/Plan: 1. Sudden fall: By description nonmechanical. However no syncope, no loss of consciousness. No focal motor deficits. By description doubt cardiac.Attempt MRI today, PT/OT/SLP evaluation done and SNF placement is recommended. 2. Possible TIA/CVA: No focal deficits on exam but history is not entirely clear. Patient feels like he had a stroke and there is not a good explanation for his fall. Stroke evaluation. For now continue aspirin. 3. Rhabdomyolysis: Secondary to lying on the floor last night without being able to get up. IV fluids. Repeat laboratory studies in the morning. 4. Hypokalemia: Replete   EDUCATION - test results and diagnostic studies were discussed with patient - patient verbalized the understanding -  questions were answered at the bedside and contact information was provided for additional questions or concerns   LOS: 2 days   MAGICK-Machai Desmith 11/05/2011, 7:12 AM  [facility] Pager: [phone]

## 2011-11-05 NOTE — Progress Notes (Signed)
Utilization Review Completed.Katee Wentland T2/25/2013   

## 2011-11-05 NOTE — Evaluation (Signed)
Terry Villa,PT Acute Rehabilitation [phone] (pager)  

## 2011-11-05 NOTE — Evaluation (Signed)
Occupational Therapy Evaluation Patient Details Name: Terry Villa MRN: [id number] DOB: 1946-07-13 Today's Date: 11/05/2011  Problem List:  Patient Active Problem List  Diagnoses  . ADENOCARCINOMA, PROSTATE  . DYSLIPIDEMIA  . HYPOKALEMIA  . HYPERTENSION  . GERD  . CONSTIPATION  . RENAL DISEASE, CHRONIC  . ERECTILE DYSFUNCTION, ORGANIC  . CARCINOMA, COLON, HX OF  . CEREBROVASCULAR ACCIDENT, HX OF  . PERSONAL HISTORY OF COLONIC POLYPS  . TOBACCO USE, QUIT    Past Medical History:  Past Medical History  Diagnosis Date  . 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 DYSLIPIDEMIA 08/26/2009  . HYPERTENSION 08/29/2009  . GERD 08/29/2009  . RENAL DISEASE, CHRONIC 08/26/2009  . 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 CARCINOMA, COLON, HX OF 06/14/2010  . Personal history of colonic polyps 07/19/2010  . TOBACCO USE, QUIT 08/29/2009  . Stroke     Residual right upper and right lower extremity weakness.  . Myocardial infarction   . Shortness of breath   . Coronary artery disease    Past Surgical History:  Past Surgical History  Procedure Date  . Liver biopsy   . Colon surgery     OT Assessment/Plan/Recommendation OT Assessment Clinical Impression Statement: Pt presents to OT with decreased I with ADL activity s/p admission to hospital after fall.  Pt will beneift from skilled OT to increase I with ADL activity and return to PLOF OT Recommendation/Assessment: Patient will need skilled OT in the acute care venue OT Problem List: Decreased strength;Decreased activity tolerance;Decreased cognition;Decreased safety awareness;Impaired balance (sitting and/or standing);Decreased knowledge of use of DME or AE Barriers to Discharge: Decreased caregiver support OT Therapy Diagnosis : Generalized weakness OT Plan OT Frequency: Min 1X/week OT Treatment/Interventions: Self-care/ADL training;DME and/or AE instruction;Therapeutic activities;Patient/family education OT Recommendation Follow  Up Recommendations: Skilled nursing facility Equipment Recommended: Defer to next venue     OT Evaluation Precautions/Restrictions  Precautions Precautions: Fall Restrictions Weight Bearing Restrictions: No Prior Functioning Home Living Lives With: Alone Type of Home: Apartment Home Layout: Other (Comment);Multi-level;Able to live on main level with bedroom/bathroom (three levels but stays on first level only) Alternate Level Stairs-Rails: Right;Left;Can reach both Alternate Level Stairs-Number of Steps: 12 stairs x 2  Home Access: Level entry Bathroom Shower/Tub: Engineer, manufacturing systems: Standard Bathroom Accessibility: No Home Adaptive Equipment: Straight cane Prior Function Level of Independence: Independent with basic ADLs;Independent with transfers;Independent with gait;Independent with homemaking with ambulation Driving: No Vocation: Retired ADL ADL Statistician: Performed;Other (comment) (performed sit to stand- then had BM standing EOB) Toileting - Clothing Manipulation: Performed;Maximal assistance Where Assessed - Toileting Clothing Manipulation: Standing Toileting - Hygiene: Performed;+2 Total assistance;Other (comment) Toileting - Hygiene Details (indicate cue type and reason): mod A for standing and total A for hygiene Where Assessed - Toileting Hygiene: Standing Ambulation Related to ADLs: mod a standing EOB for hygiene s/p incontinent BM episode ADL Comments: will need significant assist    Cognition Cognition Overall Cognitive Status: Impaired Safety/Judgement: Decreased awareness of safety precautions Decreased Safety/Judgement: Decreased awareness of need for assistance Awareness of Errors: Decreased awareness of errors made Decreased Awareness of Errors: Assistance required to correct errors made Awareness of Deficits: Decreased awareness of deficits Awareness of Deficits - Other Comments: pt is unaware of the level of assitance he needs  while ambulating and states he could do so independently Problem Solving: Requires assistance for problem solving    Extremity Assessment RUE Assessment RUE Assessment: Within Functional Limits RUE AROM (degrees) Overall AROM Right Upper Extremity: Deficits RUE Overall AROM Comments:  overall decreased ROM in shoulder and elbow  LUE Assessment LUE Assessment: Within Functional Limits    End of Session OT - End of Session Activity Tolerance: Patient tolerated treatment well Patient left: in bed;Other (comment) (pt had BM in bed and in standing- returned to bed for cleani) General Behavior During Session: [facility] for tasks performed Cognition: [facility] for tasks performed   Terry Villa, [place] Kung 11/05/2011, 3:29 PM

## 2011-11-05 NOTE — Evaluation (Signed)
Physical Therapy Evaluation Patient Details Name: Joquan Lotz MRN: [id number] DOB: 06/29/1946 Today's Date: 11/05/2011  Problem List:  Patient Active Problem List  Diagnoses  . ADENOCARCINOMA, PROSTATE  . DYSLIPIDEMIA  . HYPOKALEMIA  . HYPERTENSION  . GERD  . CONSTIPATION  . RENAL DISEASE, CHRONIC  . ERECTILE DYSFUNCTION, ORGANIC  . CARCINOMA, COLON, HX OF  . CEREBROVASCULAR ACCIDENT, HX OF  . PERSONAL HISTORY OF COLONIC POLYPS  . TOBACCO USE, QUIT    Past Medical History:  Past Medical History  Diagnosis Date  . 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 DYSLIPIDEMIA 08/26/2009  . HYPERTENSION 08/29/2009  . GERD 08/29/2009  . RENAL DISEASE, CHRONIC 08/26/2009  . 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 CARCINOMA, COLON, HX OF 06/14/2010  . Personal history of colonic polyps 07/19/2010  . TOBACCO USE, QUIT 08/29/2009  . Stroke     Residual right upper and right lower extremity weakness.  . Myocardial infarction   . Shortness of breath   . Coronary artery disease    Past Surgical History:  Past Surgical History  Procedure Date  . Liver biopsy   . Colon surgery     PT Assessment/Plan/Recommendation PT Assessment Clinical Impression Statement: Pt admitted after an unexplained fall at home and weakness. Pt had red spot over sacrum, notified nurse and she is keeping an eye on it. Pt has WNL for strength bilaterally, his R side is a little weaker from previous stroke but pt reports that his R side has been that way since the stroke. Pt has decreased ROM in bilaterally LE more so in R then L. He has flexion contractures in his knees that cause him to have a flexed trunk while walking. Pt also tends to lean to the L while ambulating. Pt states that he is unaware of the leaning. When asked pt feels that he is safe walking by himself and he feels that he could do so indepenendently, pt required mod assist for walking today. Pt reports that he previously walked with a cane, PT  recommended that he use a RW until he is steadier on his feet. Pt has decreased safety awareness and is unaware of his deficits. Pt has no one that can help him when he first goes home, PT recommends a SNF for follow up therapy at D/C. PT Recommendation/Assessment: Patient will need skilled PT in the acute care venue PT Problem List: Decreased strength;Decreased range of motion;Decreased activity tolerance;Decreased balance;Decreased mobility;Decreased coordination;Decreased safety awareness;Decreased knowledge of use of DME;Decreased knowledge of precautions;Decreased cognition;Impaired tone Barriers to Discharge: Decreased caregiver support PT Therapy Diagnosis : Difficulty walking;Abnormality of gait;Generalized weakness PT Plan PT Frequency: Min 3X/week PT Treatment/Interventions: DME instruction;Gait training;Stair training;Functional mobility training;Therapeutic activities;Therapeutic exercise;Balance training;Neuromuscular re-education;Cognitive remediation;Patient/family education PT Recommendation Follow Up Recommendations: Skilled nursing facility Equipment Recommended: Defer to next venue PT Goals  Acute Rehab PT Goals PT Goal Formulation: With patient Time For Goal Achievement: 2 weeks Pt will go Supine/Side to Sit: with supervision PT Goal: Supine/Side to Sit - Progress: Goal set today Pt will go Sit to Supine/Side: with supervision PT Goal: Sit to Supine/Side - Progress: Goal set today Pt will go Sit to Stand: with supervision PT Goal: Sit to Stand - Progress: Goal set today Pt will go Stand to Sit: with supervision PT Goal: Stand to Sit - Progress: Goal set today Pt will Transfer Bed to Chair/Chair to Bed: with supervision PT Transfer Goal: Bed to Chair/Chair to Bed - Progress: Goal set today Pt will Ambulate: >150 feet;with least  restrictive assistive device;with supervision PT Goal: Ambulate - Progress: Goal set today Pt will Perform Home Exercise Program:  Independently PT Goal: Perform Home Exercise Program - Progress: Goal set today  PT Evaluation Precautions/Restrictions  Precautions Precautions: Fall Prior Functioning  Home Living Lives With: Alone Type of Home: Apartment Home Layout: Other (Comment);Multi-level;Able to live on main level with bedroom/bathroom (three levels but stays on first level only) Alternate Level Stairs-Rails: Right;Left;Can reach both Alternate Level Stairs-Number of Steps: 12 stairs x 2  Home Access: Level entry Bathroom Shower/Tub: Engineer, manufacturing systems: Standard Bathroom Accessibility: No Home Adaptive Equipment: Straight cane Prior Function Level of Independence: Independent with basic ADLs;Independent with transfers;Independent with gait;Independent with homemaking with ambulation Driving: No Vocation: Retired Producer, television/film/video: Awake/alert Overall Cognitive Status: Impaired Orientation Level: Oriented X4 Safety/Judgement: Decreased awareness of safety precautions Decreased Safety/Judgement: Decreased awareness of need for assistance Awareness of Errors: Decreased awareness of errors made Decreased Awareness of Errors: Assistance required to correct errors made Awareness of Deficits: Decreased awareness of deficits Awareness of Deficits - Other Comments: pt is unaware of the level of assitance he needs while ambulating and states he could do so independently Problem Solving: Requires assistance for problem solving Sensation/Coordination Sensation Light Touch: Appears Intact Stereognosis: Not tested Hot/Cold: Not tested Proprioception: Not tested Coordination Gross Motor Movements are Fluid and Coordinated: Yes Fine Motor Movements are Fluid and Coordinated: Yes Extremity Assessment RUE Assessment RUE Assessment: Within Functional Limits RUE AROM (degrees) Overall AROM Right Upper Extremity: Deficits RUE Overall AROM Comments: overall decreased ROM in  shoulder and elbow  LUE Assessment LUE Assessment: Within Functional Limits RLE Assessment RLE Assessment: Within Functional Limits RLE AROM (degrees) Overall AROM Right Lower Extremity: Deficits RLE Overall AROM Comments: flexion contracture in R LE, decreased ROM in hip, knee and ankle over all  LLE Assessment LLE Assessment: Within Functional Limits LLE AROM (degrees) Overall AROM Left Lower Extremity: Deficits LLE Overall AROM Comments: flexion contracture in L knee but less so then R LE Mobility (including Balance) Bed Mobility Bed Mobility: Yes Supine to Sit: 4: Min assist Supine to Sit Details (indicate cue type and reason): needing assitance getting COM over BOS; needing cueing on hand placement and progression of exercise Sitting - Scoot to Edge of Bed: 4: Min assist Sitting - Scoot to [place] of Bed Details (indicate cue type and reason): needing assistance with scooting, used pad to help shift weight to EOB Sit to Supine: Not Tested (comment) Transfers Transfers: Yes Sit to Stand: 3: Mod assist;From bed Sit to Stand Details (indicate cue type and reason): pt needing asssitance with getting weight forward for stand, needing cueing on pushing up from the bed to stand Stand to Sit: To chair/3-in-1;4: Min assist Stand to Sit Details: needing assistance controlling speed of descent; needing cueing on hand placement  Ambulation/Gait Ambulation/Gait: Yes Ambulation/Gait Assistance: 3: Mod assist Ambulation/Gait Assistance Details (indicate cue type and reason): pt leans to the L while ambulating, he is unaware of his leaning and unaware of his need for assitance while walking, he has decreased safety awareness and needed a lot of cueing about staying close to the walker Ambulation Distance (Feet): 75 Feet Assistive device: Rolling walker Gait Pattern: Decreased stride length;Decreased step length - right;Decreased step length - left;Decreased weight shift to right;Lateral trunk lean  to left;Trunk flexed Gait velocity: very slow Stairs: No Wheelchair Mobility Wheelchair Mobility: No  Posture/Postural Control Posture/Postural Control: No significant limitations Balance Balance Assessed: Yes Dynamic Sitting Balance Dynamic Sitting -  Balance Support: No upper extremity supported;Feet supported Dynamic Sitting - Level of Assistance: 4: Min assist Dynamic Sitting - Comments: during testing strength needed assistance keeping midline; needing cueing to sit up straight  End of Session PT - End of Session Equipment Utilized During Treatment: Gait belt Activity Tolerance: Patient tolerated treatment well Patient left: in chair;with call bell in reach;Other (comment) (with chair alarm set ) Nurse Communication: Mobility status for transfers;Mobility status for ambulation General Behavior During Session: [facility] for tasks performed Cognition: [facility] for tasks performed  Elvera Bicker 11/05/2011, 1:00 PM

## 2011-11-05 NOTE — Progress Notes (Signed)
Speech Pathology: Dysphagia Treatment Note  Subjective:  Awake, alert, sitting upright in recliner  Objective:  Treatment focused on skilled observation of patient while consuming whole medication with thin water via RN administration.  Patient did not show any clinical indicators of a new dysphagia nor risk for aspiration.  RN reported patient consumed 100% of breakfast without any coughing or concerns. Completed patient education on purpose of follow-up given h/o CVA and dysphagia.  Reviewed standard aspiration precautions.  Assessment: Functional oral-pharyngeal swallow without any clinical indicators of a new dysphagia.  No further skilled treatment at this time.  Recommendations:  1.  Continue Regular/Thin liquid diet 2.  D/C skilled SLP services  Pain:   none Intervention Required:   No  Goals: All Goals Met  Myra Rude, M.S.,CCC-SLP Pager [phone]

## 2011-11-06 LAB — CBC
Hemoglobin: 12 g/dL — ABNORMAL LOW (ref 13.0–17.0)
Platelets: 218 10*3/uL (ref 150–400)
RBC: 3.59 MIL/uL — ABNORMAL LOW (ref 4.22–5.81)
WBC: 5.3 10*3/uL (ref 4.0–10.5)

## 2011-11-06 LAB — BASIC METABOLIC PANEL
CO2: 20 mEq/L (ref 19–32)
Calcium: 9.1 mg/dL (ref 8.4–10.5)
Chloride: 106 mEq/L (ref 96–112)
Glucose, Bld: 97 mg/dL (ref 70–99)
Potassium: 3.7 mEq/L (ref 3.5–5.1)
Sodium: 135 mEq/L (ref 135–145)

## 2011-11-06 NOTE — Consult Note (Signed)
Received by Marinda Elk EdD

## 2011-11-06 NOTE — Progress Notes (Signed)
Patient ID: Terry Villa, male   DOB: 01/08/1946, 66 y.o.   MRN: [id number]  Subjective: No events overnight. Patient denies chest pain, shortness of breath, abdominal pain. He reports generalized weakness.  Objective:  Vital signs in last 24 hours:  Filed Vitals:   11/06/11 0800 11/06/11 1200 11/06/11 1600 11/06/11 2000  BP: 144/80 137/71 108/65 115/63  Pulse: 62 53 60 59  Temp: 97.6 F (36.4 C) 98.3 F (36.8 C) 98.3 F (36.8 C) 98 F (36.7 C)  TempSrc: Oral Oral Oral Oral  Resp: 18 18 18 20   Height:      Weight:      SpO2: 99% 99% 99% 100%    Intake/Output from previous day:   Intake/Output Summary (Last 24 hours) at 11/06/11 2145 Last data filed at 11/06/11 1700  Gross per 24 hour  Intake    480 ml  Output   2500 ml  Net  -2020 ml    Physical Exam: General: Alert, awake, oriented x3, in no acute distress. HEENT: No bruits, no goiter. Moist mucous membranes, no scleral icterus, no conjunctival pallor. Heart: Regular rate and rhythm, S1/S2 +, no murmurs, rubs, gallops. Lungs: Clear to auscultation bilaterally. No wheezing, no rhonchi, no rales.  Abdomen: Soft, nontender, nondistended, positive bowel sounds. Extremities: No clubbing or cyanosis, no pitting edema,  positive pedal pulses. Neuro: Grossly nonfocal.  Lab Results:  Basic Metabolic Panel:    Component Value Date/Time   NA 135 11/06/2011 0527   K 3.7 11/06/2011 0527   CL 106 11/06/2011 0527   CO2 20 11/06/2011 0527   BUN 14 11/06/2011 0527   CREATININE 1.02 11/06/2011 0527   GLUCOSE 97 11/06/2011 0527   CALCIUM 9.1 11/06/2011 0527   CBC:    Component Value Date/Time   WBC 5.3 11/06/2011 0527   WBC 5.4 01/09/2010 1126   HGB 12.0* 11/06/2011 0527   HGB 13.9 01/09/2010 1126   HCT 34.3* 11/06/2011 0527   HCT 40.4 01/09/2010 1126   PLT 218 11/06/2011 0527   PLT 229 01/09/2010 1126   MCV 95.5 11/06/2011 0527   MCV 97.5 01/09/2010 1126   NEUTROABS 7.3 11/03/2011 1245   NEUTROABS 3.8 01/09/2010 1126   LYMPHSABS 0.5*  11/03/2011 1245   LYMPHSABS 1.1 01/09/2010 1126   MONOABS 0.6 11/03/2011 1245   MONOABS 0.4 01/09/2010 1126   EOSABS 0.0 11/03/2011 1245   EOSABS 0.1 01/09/2010 1126   BASOSABS 0.0 11/03/2011 1245   BASOSABS 0.0 01/09/2010 1126      Lab 11/06/11 0527 11/05/11 0600 11/04/11 0940 11/03/11 1819 11/03/11 1245  WBC 5.3 5.4 4.5 6.1 8.4  HGB 12.0* 11.8* 12.0* 12.9* 13.5  HCT 34.3* 32.6* 33.7* 35.9* 37.9*  PLT 218 209 227 227 234  MCV 95.5 95.6 95.7 93.5 93.1  MCH 33.4 34.6* 34.1* 33.6 33.2  MCHC 35.0 36.2* 35.6 35.9 35.6  RDW 12.0 12.2 12.2 11.8 11.7  LYMPHSABS -- -- -- -- 0.5*  MONOABS -- -- -- -- 0.6  EOSABS -- -- -- -- 0.0  BASOSABS -- -- -- -- 0.0  BANDABS -- -- -- -- --    Lab 11/06/11 0527 11/05/11 0600 11/04/11 0940 11/03/11 1819 11/03/11 1245  NA 135 137 142 -- 134*  K 3.7 3.8 3.2* -- 3.4*  CL 106 110 112 -- 102  CO2 20 20 19  -- 19  GLUCOSE 97 98 63* -- 102*  BUN 14 12 11  -- 12  CREATININE 1.02 1.08 1.18 1.04 0.98  CALCIUM 9.1 8.7 8.6 --  9.5  MG -- -- -- -- --   No results found for this basename: INR:5,PROTIME:5 in the last 168 hours Cardiac markers:  Lab 11/05/11 0600 11/04/11 0505 11/03/11 1245  CKMB 7.8* 15.9* 35.5*  TROPONINI <0.30 -- <0.30  MYOGLOBIN -- -- --   No components found with this basename: POCBNP:3 No results found for this or any previous visit (from the past 240 hour(s)).  Studies/Results: No results found.  Medications: Scheduled Meds:   . aspirin  325 mg Oral Daily  . enoxaparin  40 mg Subcutaneous QHS  . lactulose  20 g Oral BID  . metoprolol tartrate  25 mg Oral BID  . pantoprazole  40 mg Oral Q1200  . Tamsulosin HCl  0.4 mg Oral QHS  . topiramate  100 mg Oral QHS   Continuous Infusions:  PRN Meds:.acetaminophen, acetaminophen, ondansetron (ZOFRAN) IV, senna-docusate  Assessment/Plan:  Generalized weakness - unclear etiology at this time and given normal neurologic examination I am not worried about stroke but this could have as well be  TIA - will follow up on PT evaluation as they have recommended SNF placement but PT REFUSES - continue providing supportive care  HTN - remains stable  HLD - stable  DVT prophylaxis: LOVENOX   EDUCATION - test results and diagnostic studies were discussed with patient  - patient verbalized the understanding - questions were answered at the bedside and contact information was provided for additional questions or concerns - pt refuses SNF   LOS: 3 days   MAGICK-Tayla Panozzo 11/06/2011, 9:45 PM  [facility] Pager: [phone]

## 2011-11-07 DIAGNOSIS — K922 Gastrointestinal hemorrhage, unspecified: Secondary | ICD-10-CM

## 2011-11-07 DIAGNOSIS — R1319 Other dysphagia: Secondary | ICD-10-CM

## 2011-11-07 DIAGNOSIS — K6289 Other specified diseases of anus and rectum: Secondary | ICD-10-CM

## 2011-11-07 LAB — CBC
HCT: 33.3 % — ABNORMAL LOW (ref 39.0–52.0)
Hemoglobin: 11.7 g/dL — ABNORMAL LOW (ref 13.0–17.0)
Hemoglobin: 12.2 g/dL — ABNORMAL LOW (ref 13.0–17.0)
MCH: 33.7 pg (ref 26.0–34.0)
MCHC: 35.1 g/dL (ref 30.0–36.0)
MCHC: 35.1 g/dL (ref 30.0–36.0)
RBC: 3.62 MIL/uL — ABNORMAL LOW (ref 4.22–5.81)
WBC: 4.3 10*3/uL (ref 4.0–10.5)

## 2011-11-07 LAB — BASIC METABOLIC PANEL
BUN: 15 mg/dL (ref 6–23)
Calcium: 9.1 mg/dL (ref 8.4–10.5)
GFR calc non Af Amer: 70 mL/min — ABNORMAL LOW (ref >90)
Glucose, Bld: 98 mg/dL (ref 70–99)

## 2011-11-07 LAB — HEMOGLOBIN AND HEMATOCRIT, BLOOD: HCT: 34 % — ABNORMAL LOW (ref 39.0–52.0)

## 2011-11-07 LAB — PROTIME-INR
INR: 0.92 (ref 0.00–1.49)
Prothrombin Time: 12.6 seconds (ref 11.6–15.2)

## 2011-11-07 LAB — CLOSTRIDIUM DIFFICILE BY PCR: Toxigenic C. Difficile by PCR: NEGATIVE

## 2011-11-07 MED ORDER — POLYETHYLENE GLYCOL 3350 17 GM/SCOOP PO POWD
1.0000 | Freq: Once | ORAL | Status: AC
  Administered 2011-11-07: 1 via ORAL
  Filled 2011-11-07: qty 255

## 2011-11-07 MED ORDER — PANTOPRAZOLE SODIUM 40 MG IV SOLR
40.0000 mg | Freq: Two times a day (BID) | INTRAVENOUS | Status: DC
  Administered 2011-11-07: 40 mg via INTRAVENOUS
  Filled 2011-11-07 (×4): qty 40

## 2011-11-07 NOTE — Progress Notes (Signed)
.  Clinical social worker completed patient psychosocial assessment, please see assessment in patient shadow chart.  Pt adamantly refusing skilled nursing placement for short term rehab. Pt states he has a Network engineer and friends that check on patient regularly and who provide pt transportation for his needs. Pt stated he will work with home health if they can do physical therapy at home. CSW and Pt discussed safety concerns and risks, and pt stated, " I will be ok with those who look out for me, and I want to go home with home health." CSW to inform RN case manager. .Clinical social worker continuing to follow pt to assist with pt dc plans and further csw needs.   Catha Gosselin, Theresia Majors  [phone] .11/07/2011 12:23pm

## 2011-11-07 NOTE — Progress Notes (Signed)
Physical Therapy Treatment Patient Details Name: Terry Villa MRN: [id number] DOB: [birth year] Today's Date: 11/07/2011  PT Assessment/Plan  PT - Assessment/Plan Comments on Treatment Session: Pt very pleasant & willing to participate in PT session.  Used RW during ambulation today & mod (A) for balance & safety.   PT Frequency: Min 3X/week Follow Up Recommendations: Skilled nursing facility Equipment Recommended: Defer to next venue PT Goals  Acute Rehab PT Goals PT Goal: Supine/Side to Sit - Progress: Progressing toward goal PT Goal: Sit to Stand - Progress: Progressing toward goal PT Goal: Stand to Sit - Progress: Progressing toward goal PT Goal: Ambulate - Progress: Progressing toward goal  PT Treatment Precautions/Restrictions  Precautions Precautions: Fall Restrictions Weight Bearing Restrictions: No Mobility (including Balance) Bed Mobility Supine to Sit: 5: Supervision Supine to Sit Details (indicate cue type and reason): (S) for safety.  Cues for UE use to increase ease of transition.   Sitting - Scoot to Edge of Bed: 4: Min assist Sitting - Scoot to [place] of Bed Details (indicate cue type and reason): (A) for lateral weight shifting to scoot hips closer to EOB.   Transfers Sit to Stand: 4: Min assist;From chair/3-in-1;From bed;With upper extremity assist;With armrests Sit to Stand Details (indicate cue type and reason): (A) for anterior translation of trunk over BOS, balance, safety.  Cues for hand placement & technique.   Stand to Sit: 4: Min assist;To chair/3-in-1;With upper extremity assist;With armrests Stand to Sit Details: (A) to control descent & safety.  Cues for hand placement, body positioning before sitting, & safety.  Pt attempting to sit before safely positioning body & RW.   Ambulation/Gait Ambulation/Gait Assistance: 3: Mod assist Ambulation/Gait Assistance Details (indicate cue type and reason): Used RW.  Drags Rt foot- cues for increased floor clearance.   Continues to demonstrate decreased safety awareness while ambulating.  (A) for balance, RW management, & safety.  Pt with uncoordinated step with Rt foot almost crossing midline or stepping on top of Lt foot, hip/knee flexion, poor floor clearance Ambulation Distance (Feet): 100 Feet Assistive device: Rolling walker Gait Pattern: Decreased step length - right;Trunk flexed;Decreased stride length;Right flexed knee in stance;Left flexed knee in stance;Decreased dorsiflexion - right;Scissoring Stairs: No Wheelchair Mobility Wheelchair Mobility: No  Posture/Postural Control Posture/Postural Control: No significant limitations Exercise    End of Session PT - End of Session Equipment Utilized During Treatment: Gait belt Activity Tolerance: Patient tolerated treatment well Patient left: in chair;with call bell in reach General Behavior During Session: [facility] for tasks performed Cognition: Impaired (decreased safety awareness)  Lara Mulch 11/07/2011, 1:17 PM [phone]

## 2011-11-07 NOTE — Progress Notes (Signed)
Pt had darker stool today than yesterday with bright red blood on outside smeared. Hgb little over 11. Pt reported having some lower abdominal pain last pm. Pt stated he has has Hemoroids in the past. Guiac negative. Will monitor

## 2011-11-07 NOTE — Progress Notes (Signed)
Patient ID: Terry Villa    ZOX:[id number]    DOB: [birth year]    DOA: 11/03/2011  PCP: Rene Paci, MD, MD  Subjective: Since today, patient having frankly bloody stools  Objective: Weight change: -0.545 kg (-1 lb 3.2 oz)  Intake/Output Summary (Last 24 hours) at 11/07/11 1744 Last data filed at 11/07/11 1300  Gross per 24 hour  Intake    600 ml  Output   1350 ml  Net   -750 ml   Blood pressure 137/83, pulse 62, temperature 99 F (37.2 C), temperature source Oral, resp. rate 20, height 5\' 8"  (1.727 m), weight 60.6 kg (133 lb 9.6 oz), SpO2 93.00%.  Physical Exam: General: Alert and awake, oriented, not in any acute distress. HEENT: anicteric sclera, pupils reactive to light and accommodation, EOMI CVS: S1-S2 clear, no murmur rubs or gallops Chest: clear to auscultation bilaterally, no wheezing, rales or rhonchi Abdomen: soft nontender, nondistended, normal bowel sounds, no organomegaly Extremities: no cyanosis, clubbing or edema noted bilaterally Neuro: Cranial nerves II-XII intact, no focal neurological deficits  Lab Results: Basic Metabolic Panel:  Lab 11/07/11 8119 11/06/11 0527  NA 140 135  K 3.9 3.7  CL 109 106  CO2 22 20  GLUCOSE 98 97  BUN 15 14  CREATININE 1.08 1.02  CALCIUM 9.1 9.1  MG -- --  PHOS -- --   Liver Function Tests:  Lab 11/03/11 1245  AST 39*  ALT 23  ALKPHOS 83  BILITOT 0.6  PROT 6.4  ALBUMIN 3.6   CBC:  Lab 11/07/11 1413 11/07/11 0615 11/06/11 0527 11/03/11 1245  WBC -- 4.8 5.3 --  NEUTROABS -- -- -- 7.3  HGB 11.9* 11.7* -- --  HCT 34.0* 33.3* -- --  MCV -- 96.0 95.5 --  PLT -- 229 218 --   Cardiac Enzymes:  Lab 11/05/11 0600 11/04/11 0505 11/03/11 1245  CKTOTAL 283* 538* 1025*  CKMB 7.8* 15.9* 35.5*  CKMBINDEX -- -- --  TROPONINI <0.30 -- <0.30   BNP: No components found with this basename: POCBNP:2 CBG:  Lab 11/06/11 2047  GLUCAP 126*     Micro Results: Recent Results (from the past 240 hour(s))  CLOSTRIDIUM  DIFFICILE BY PCR     Status: Normal   Collection Time   11/07/11  3:38 PM      Component Value Range Status Comment   C difficile by pcr NEGATIVE  NEGATIVE  Final     Studies/Results: Ct Head Wo Contrast  11/03/2011 IMPRESSION:  1.  No acute intracranial abnormality. 2.  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 3.  Stable mild to moderate generalized atrophy and mild chronic microvascular ischemic changes of the white matter.  Original Report Authenticated By: Arnell Sieving, M.D.    Medications: Scheduled Meds:   . metoprolol tartrate  25 mg Oral BID  . pantoprazole (PROTONIX) IV  40 mg Intravenous Q12H  . polyethylene glycol powder  1 Container Oral Once  . Tamsulosin HCl  0.4 mg Oral QHS  . topiramate  100 mg Oral QHS  . DISCONTD: aspirin  325 mg Oral Daily  . DISCONTD: enoxaparin  40 mg Subcutaneous QHS  . DISCONTD: lactulose  20 g Oral BID  . DISCONTD: pantoprazole  40 mg Oral Q1200   Continuous Infusions:    Assessment/Plan: Active Problems:  Lower GI bleed possibly diverticular bleeding, painless  - Aspirin and Lovenox discontinued today, is on SCDs, Protonix IV - GI consulted, colonoscopy planned. Repeat hemoglobin stable  Dysphagia: Chronic - GI planning endoscopy  Generalized weakness - PT evaluation done, recommend SNF, however patient refuses.  DVT Prophylaxis:Bilateral SCDs  Code Status:Full code   Disposition:Not medically ready   LOS: 4 days   Jael Kostick M.D. [facility] 11/07/2011, 5:44 PM Pager: [phone]

## 2011-11-07 NOTE — Progress Notes (Signed)
Pt had a second stool today. Bright red watery stool with a couple of chunks were noted. Sent another [place] stool to lab. Will save specimen for Dr. Isidoro Donning on rounds. Will monitor pt closely.

## 2011-11-07 NOTE — Consult Note (Signed)
[facility] Consult: 2:10 PM 11/07/2011   Referring Provider: Dr Isidoro Donning  Primary Care Physician:  Rene Paci, MD Primary Gastroenterologist:  Dr. Arlyce Dice   Reason for Consultation:  Hematochezia  HPI: Terry Villa is a 66 y.o. male.  Question of hx of colon cancer (has no visible scars on abdomen), had large adenomatous colon polyp in Oct 2011.  S/P radiation treatment of prostate 2010 - 2011. Interestingly he recalls no hx of colon surgery, colonoscopy or prostate cancer. There is also listing of a prior liver biopsy in old records but I find no pathology or imaging,  documentation to support this claim.  Hx CVA 1990s.  Admitted 4 days ago with rhabdo after laying overnite on floor following a fall. Renal function was normal.  During hospitalization SLP has evaluated dysphagia at bedside and he is cleared for solids and thin liquids. He says that for about 9 months, solid food has consistently felt like it was hanging up in his chest and points to location corresponding to the GE junction.  He has not had to regurgitate or vomit up food, as it will pass if he drinks liquids.    Has been receiving ASA and Lovenox.  This AM developed frankly bloody stools. Lovenox and aspirin dc'd by Dr Isidoro Donning. Pt says he had been passing frank blood mixed with normal appearing stool since last Monday, 10 days ago.  This occurred every other day on about 3 occasions.  The blood today is c/w this trend.  Recall no prior episodes of BPR.  Has chronic, several years hx of mid-abdominal pain and constipation.  Pain is not changed, it is intermittent.  It is not relieved by evacuation and not worsened by POs.   Home meds include Lactulose, Prilosec, 325 ASA.  No NSAIDs.  Drinks at most 3 to 4 beers during card games once a week.    Past Medical History  Diagnosis Date  . 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 DYSLIPIDEMIA 08/26/2009  . HYPERTENSION 08/29/2009  . GERD 08/29/2009    . RENAL DISEASE, CHRONIC 08/26/2009  . 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 CARCINOMA, COLON, HX OF 06/14/2010  . Personal history of colonic polyps 07/19/2010  . TOBACCO USE, QUIT 08/29/2009  . Stroke     Residual right upper and right lower extremity weakness.  . Myocardial infarction   . Shortness of breath   . Coronary artery disease     Past Surgical History  Procedure Date  . Liver biopsy   . Colon surgery     Prior to Admission medications   Medication Sig Start Date End Date Taking? Authorizing Provider  aspirin 325 MG EC tablet Take 325 mg by mouth daily.     Yes Historical Provider, MD  lactulose (CHRONULAC) 10 GM/15ML solution Take 20 g by mouth 2 (two) times daily.     Yes Historical Provider, MD  metoprolol tartrate (LOPRESSOR) 25 MG tablet Take 25 mg by mouth 2 (two) times daily.   Yes Historical Provider, MD  omeprazole (PRILOSEC) 20 MG capsule Take 20 mg by mouth daily.   Yes Historical Provider, MD  Tamsulosin HCl (FLOMAX) 0.4 MG CAPS Take 0.4 mg by mouth at bedtime.   Yes Historical Provider, MD  topiramate (TOPAMAX) 100 MG tablet Take 100 mg by mouth at bedtime.   Yes Historical Provider, MD  vardenafil (LEVITRA) 10 MG tablet Take 10 mg by mouth daily as needed. For E.D.   Yes Historical Provider, MD    Scheduled Meds:    .  metoprolol tartrate  25 mg Oral BID  . pantoprazole (PROTONIX) IV  40 mg Intravenous Q12H  . Tamsulosin HCl  0.4 mg Oral QHS  . topiramate  100 mg Oral QHS  . DISCONTD: aspirin  325 mg Oral Daily  . DISCONTD: enoxaparin  40 mg Subcutaneous QHS  . DISCONTD: lactulose  20 g Oral BID  . DISCONTD: pantoprazole  40 mg Oral Q1200   Infusions:   PRN Meds: acetaminophen, acetaminophen, ondansetron (ZOFRAN) IV, senna-docusate   Allergies as of 11/03/2011  . (No Known Allergies)    Family History  Problem Relation Age of Onset  . Breast cancer Mother   . Heart disease Father   . Clotting disorder Maternal Uncle     History    Social History  . Marital Status: Widowed    Spouse Name: N/A    Number of Children: N/A  . Years of Education: N/A   Occupational History  . Not on file.   Social History Main Topics  . Smoking status: Former Smoker    Types: Cigarettes  . Smokeless tobacco: Current User   Comment: Widowed, lives alone-but has lady friend. enjoys spending time with his 7 g-kids  . Alcohol Use: Yes     occasional  . Drug Use: No  . Sexually Active: Yes   Other Topics Concern  . Not on file   Social History Narrative  . No narrative on file    REVIEW OF SYSTEMS: Constitutional:  Generally weak.  Weight: 141 # in 06/2010, 134 # 11/05/11 ENT:  No nose bleeds Pulm:  No SOB or cough CV:  No palpitations or chest pain.  No pedal edema GU:  3 to 4 episodes nocturia per night. GI:  As above Heme:  No hx of anemia.    Transfusions:  None per his recall Neuro:  No headache.  Wears reading glasses Derm:  No rash or sores.  Several tatoos, some done in prison in 1980s Endocrine:  No excessive thirst or urination.  No blood in urine Immunization:  Flu, zoster, pneumovax up to date. Travel:  None beyond 50 miles.   PHYSICAL EXAM: Vital signs in last 24 hours: Temp:  [97.4 F (36.3 C)-99 F (37.2 C)] 98.2 F (36.8 C) (02/27 1200) Pulse Rate:  [56-71] 59  (02/27 1200) Resp:  [18-20] 18  (02/27 1200) BP: (108-153)/(63-82) 136/82 mmHg (02/27 1200) SpO2:  [97 %-100 %] 98 % (02/27 1200) Weight:  [133 lb 9.6 oz (60.6 kg)] 133 lb 9.6 oz (60.6 kg) (02/27 0400)  General: Thin, mildly unwell looking WM.  Looks his age Head:  No evidence of trauma  Eyes:  No conjunctival pallor Ears:  Not HOH  Nose:  Skin changes sugg of rhinophyma, but not erythematous Mouth:  Full dentures in place, not removed.   Moist clear mucosa Neck:  No mass or JVD Lungs:  Clear B Heart: RRR.  No MRG Abdomen:  Soft, NT, ND, active BS.  No mass or HSM.  No bruits. No scars to point to prior surgery.   Rectal: stool in  bed pan is maroon red with clots present.  Volume is about 6 oz.   Musc/Skeltl: no joint deformities. Extremities:  No pedal edema  Neurologic:  Poor recall of past history but good recall of recent events.  No tremor, no asterixis.  Moves all 4's.   Skin:  Rash on left elbow, looks like a "carpet burn".  No purpura Tattoos:  Several on fingers and arms,  look amateur/homemade. Nodes:  None at groin   Psych:  Pleasant, not agitated.  My findings are same Terry Boop, MD, [facility]  Intake/Output from previous day: 02/26 0701 - 02/27 0700 In: 480 [P.O.:480] Out: 1600 [Urine:1600] Intake/Output this shift: Total I/O In: 600 [P.O.:600] Out: 750 [Urine:750]  LAB RESULTS:  Basename 11/07/11 0615 11/06/11 0527 11/05/11 0600  WBC 4.8 5.3 5.4  HGB 11.7* 12.0* 11.8*  HCT 33.3* 34.3* 32.6*  PLT 229 218 209   BMET Lab Results  Component Value Date   NA 140 11/07/2011   NA 135 11/06/2011   NA 137 11/05/2011   K 3.9 11/07/2011   K 3.7 11/06/2011   K 3.8 11/05/2011   CL 109 11/07/2011   CL 106 11/06/2011   CL 110 11/05/2011   CO2 22 11/07/2011   CO2 20 11/06/2011   CO2 20 11/05/2011   GLUCOSE 98 11/07/2011   GLUCOSE 97 11/06/2011   GLUCOSE 98 11/05/2011   BUN 15 11/07/2011   BUN 14 11/06/2011   BUN 12 11/05/2011   CREATININE 1.08 11/07/2011   CREATININE 1.02 11/06/2011   CREATININE 1.08 11/05/2011   CALCIUM 9.1 11/07/2011   CALCIUM 9.1 11/06/2011   CALCIUM 8.7 11/05/2011             ENDOSCOPIC STUDIES: Colonoscopy 2011:   Large pedunculated adenomatous polyp was removed 1) 1.8 cm pedunculated polyp in the sigmoid colon  2) Mild diverticulosis in the sigmoid colon  3) Otherwise normal examination  RECOMMENDATIONS:  1) followup colonoscopy in 3 years, because of polyp size and  limitation of today's exam  2) high fiber diet  3) miralax as needed for constipation  4) OV 1 month  Speach Language evaluation 11/04/11 SLP Assessment  Clinical Impression Statement: Patient presents  with minimal delay in initiation of swallow. Oropharyngeal swallow functional with no observed s/s of aspiration noted. Proceed with regular consistency diet with thin liquids with intermittent supervision. ST to follow 1 to 2x for diet tolerance due to history of CVA and dysphagia .  Risk for Aspiration: Mild  Other Related Risk Factors: History of dysphagia;History of GERD;Previous CVA  Swallow Evaluation Recommendations  Solid Consistency: Regular  Liquid Consistency: Thin  Liquid Administration via: Cup;Straw  Medication Administration: Whole meds with liquid  Supervision: Intermittent supervision to cue for compensatory strategies  Compensations: Slow rate;Small sips/bites  Postural Changes and/or Swallow Maneuvers: Seated upright 90 degrees;Upright 30-60 min after meal   Interventions: Diet toleration management by SLP;Patient/family education;Aspiration precaution training  Prognosis  Prognosis for Safe Diet Advancement: Good    IMPRESSION: 1.  GI Bleed, suspect LGI source and not rapid UGI bleed.   Known hx of large adenomatous polyp in 2011 along with diverticulosis.  I suspect the bleeding is diverticular.   Has had prostate irradiation 2010-2011 so radiation proctitis also a possible source, but not as likely to cause significant volume of bleeding. 2.  Chart mention of colon cancer and surgery, pt does not recall this.  No strong evidence to confirm this and no visible scars on abdomen.   3.  CVA 1992.  Note expressive aphasia/dysarthria during interview today   4.  Rhabdomyolisis.  Resolving.  Back to baseline of chronic lower extremity weakness. 5.  Solid dysphagia.     PLAN: 1.  Follow CBC.  2.  Colonoscopy, EGD?  If not would be inclined to allow resumption of regular diet.   3.  This is not C Diff, so I cancelled C Diff  testing that was ordered.  I will however order CBC for the AM along with PT/INR    LOS: 4 days   Jennye Moccasin  11/07/2011, 2:10 PM Pager:  [phone]  Attending:  I have seen and evaluated the patient also. History reviewed and no changes. Exam same.  He has  1) Lower GI bleeding of unclear cause (diverticular bleed most likely but would be stuttering) 2) Dysphagia.  Plan EGD/dilation and colonoscopy to evaluate and treat. Will do colonoscopy first.  The risks and benefits as well as alternatives of endoscopic procedure(s) have been discussed and reviewed. All questions answered. The patient agrees to proceed.   Terry Boop, MD, [facility] [phone] (pager) 11/07/2011 4:11 PM

## 2011-11-08 ENCOUNTER — Encounter (HOSPITAL_COMMUNITY)
Admission: EM | Disposition: A | Discharge status: Home Health | Payer: Self-pay | Source: Home / Self Care | Attending: Internal Medicine

## 2011-11-08 DIAGNOSIS — K627 Radiation proctitis: Secondary | ICD-10-CM | POA: Diagnosis present

## 2011-11-08 LAB — CBC
Hemoglobin: 12 g/dL — ABNORMAL LOW (ref 13.0–17.0)
MCV: 96.4 fL (ref 78.0–100.0)
Platelets: 230 10*3/uL (ref 150–400)
RBC: 3.59 MIL/uL — ABNORMAL LOW (ref 4.22–5.81)
WBC: 4.8 10*3/uL (ref 4.0–10.5)

## 2011-11-08 SURGERY — COLONOSCOPY, ESOPHAGOGASTRODUODENOSCOPY (EGD) AND ESOPHAGEAL DILATION (ED)
Anesthesia: Moderate Sedation

## 2011-11-08 MED ORDER — PANTOPRAZOLE SODIUM 40 MG PO TBEC
40.0000 mg | DELAYED_RELEASE_TABLET | Freq: Every day | ORAL | Status: DC
  Administered 2011-11-09: 40 mg via ORAL
  Filled 2011-11-08: qty 1

## 2011-11-08 MED ORDER — SODIUM CHLORIDE 0.9 % IV SOLN
Freq: Once | INTRAVENOUS | Status: AC
  Administered 2011-11-08: 09:00:00 via INTRAVENOUS

## 2011-11-08 MED ORDER — MIDAZOLAM HCL 10 MG/2ML IJ SOLN
INTRAMUSCULAR | Status: DC | PRN
  Administered 2011-11-08 (×2): 2 mg via INTRAVENOUS
  Administered 2011-11-08: 1 mg via INTRAVENOUS
  Administered 2011-11-08: 2 mg via INTRAVENOUS

## 2011-11-08 MED ORDER — FENTANYL CITRATE 0.05 MG/ML IJ SOLN
INTRAMUSCULAR | Status: AC
  Filled 2011-11-08: qty 4

## 2011-11-08 MED ORDER — FENTANYL CITRATE 0.05 MG/ML IJ SOLN
INTRAMUSCULAR | Status: DC | PRN
  Administered 2011-11-08 (×2): 12.5 ug via INTRAVENOUS
  Administered 2011-11-08: 25 ug via INTRAVENOUS

## 2011-11-08 MED ORDER — MIDAZOLAM HCL 10 MG/2ML IJ SOLN
INTRAMUSCULAR | Status: AC
  Filled 2011-11-08: qty 2

## 2011-11-08 NOTE — Progress Notes (Signed)
Patient refused to sign consent for Colonoscopy and EGD this morning. He said he was too anxious to talk about it and that he would think about signing it later. Will follow up with next shift.  Harless Litten, RN 11/08/11

## 2011-11-08 NOTE — Op Note (Signed)
[name] [address] [place], [place]  [address]  COLONOSCOPY PROCEDURE REPORT  PATIENT:  Terry Villa, Terry Villa  MR#:  [id number] BIRTHDATE:  05-26-1946, 66 yrs. old  GENDER:  male ENDOSCOPIST:  Iva Boop, MD, [facility]  PROCEDURE DATE:  11/08/2011 PROCEDURE:  Colonoscopy with ablation ASA CLASS:  Class III INDICATIONS:  hematochezia MEDICATIONS:   Fentanyl 37.5 mcg IV, Versed 6 mg IV  DESCRIPTION OF PROCEDURE:   After the risks benefits and alternatives of the procedure were thoroughly explained, informed consent was obtained.  Digital rectal exam was performed and revealed no rectal masses.   The Pentax Colonoscope [id number] endoscope was introduced through the anus and advanced to the cecum, which was identified by both the appendix and ileocecal valve, without limitations.  The quality of the prep was adequate, using MoviPrep.  The instrument was then slowly withdrawn as the colon was fully examined. <<PROCEDUREIMAGES>>  FINDINGS:  There were mucosal changes consistent with radiation proctitis seen in the rectum. in the rectum.. 50 and 1L settings Moderate diverticulosis was found in the sigmoid colon.  This was otherwise a normal examination of the colon but it was redundant. Retroflexed views in the rectum revealed radiation proctitis. The time to cecum = 14 minutes. The scope was then withdrawn in 14 minutes from the cecum and the procedure completed. 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 Arlyce Dice) RECOMMENDATIONS: 1) EGD next (dysphagia) 2) Outpatient follow-up with Dr. Arlyce Dice after dc re: need for further APC treatment REPEAT EXAM:  In 5 year(s) for routine screening colonoscopy. Cancel prior recalls.  Iva Boop, MD, [facility]  CC:  Melvia Heaps,  MD  n. Rosalie Doctor:   Iva Boop at 11/08/2011 11:13 AM  Hildred Laser, [phone]

## 2011-11-08 NOTE — Progress Notes (Signed)
Patient ID: Littleton Haub    AOZ:[id number]    DOB: 1945-11-16    DOA: 11/03/2011  PCP: Rene Paci, MD, MD  Subjective: No repeat bleeding  Objective: Weight change: 0 kg (0 lb)  Intake/Output Summary (Last 24 hours) at 11/08/11 1430 Last data filed at 11/08/11 1132  Gross per 24 hour  Intake    500 ml  Output    200 ml  Net    300 ml   Blood pressure 143/90, pulse 54, temperature 97.3 F (36.3 C), temperature source Oral, resp. rate 18, height 5\' 8"  (1.727 m), weight 60.6 kg (133 lb 9.6 oz), SpO2 99.00%.  Physical Exam: General: Alert and awake, oriented, not in any acute distress. HEENT: anicteric sclera, pupils reactive to light and accommodation, EOMI CVS: S1-S2 clear, no murmur rubs or gallops Chest: clear to auscultation bilaterally, no wheezing, rales or rhonchi Abdomen: soft nontender, nondistended, normal bowel sounds, no organomegaly Extremities: no cyanosis, clubbing or edema noted bilaterally Neuro: Cranial nerves II-XII intact, no focal neurological deficits  Lab Results: Basic Metabolic Panel:  Lab 11/07/11 9629 11/06/11 0527  NA 140 135  K 3.9 3.7  CL 109 106  CO2 22 20  GLUCOSE 98 97  BUN 15 14  CREATININE 1.08 1.02  CALCIUM 9.1 9.1  MG -- --  PHOS -- --   Liver Function Tests:  Lab 11/03/11 1245  AST 39*  ALT 23  ALKPHOS 83  BILITOT 0.6  PROT 6.4  ALBUMIN 3.6   CBC:  Lab 11/08/11 0510 11/07/11 1913 11/03/11 1245  WBC 4.8 4.3 --  NEUTROABS -- -- 7.3  HGB 12.0* 12.2* --  HCT 34.6* 34.8* --  MCV 96.4 96.1 --  PLT 230 242 --   Cardiac Enzymes:  Lab 11/05/11 0600 11/04/11 0505 11/03/11 1245  CKTOTAL 283* 538* 1025*  CKMB 7.8* 15.9* 35.5*  CKMBINDEX -- -- --  TROPONINI <0.30 -- <0.30   BNP: No components found with this basename: POCBNP:2 CBG:  Lab 11/06/11 2047  GLUCAP 126*     Micro Results: Recent Results (from the past 240 hour(s))  CLOSTRIDIUM DIFFICILE BY PCR     Status: Normal   Collection Time   11/07/11  3:38 PM       Component Value Range Status Comment   C difficile by pcr NEGATIVE  NEGATIVE  Final     Studies/Results: Ct Head Wo Contrast  11/03/2011 IMPRESSION:  1.  No acute intracranial abnormality. 2.  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 3.  Stable mild to moderate generalized atrophy and mild chronic microvascular ischemic changes of the white matter.  Original Report Authenticated By: Arnell Sieving, M.D.    Medications: Scheduled Meds:    . sodium chloride   Intravenous Once  . metoprolol tartrate  25 mg Oral BID  . pantoprazole  40 mg Oral Q0600  . polyethylene glycol powder  1 Container Oral Once  . Tamsulosin HCl  0.4 mg Oral QHS  . topiramate  100 mg Oral QHS  . 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 - Patient lives alone, was admitted with a fall, was lying on the floor onto the patient was found by a neighbor, hence unsafe situation for the patient. I have called psychiatry evaluation for capacity.  DVT Prophylaxis:Bilateral SCDs  Code Status:Full code   Disposition: Await psychiatry recommendations regarding capacity,  SW aware  LOS: 5 days   Jaquelinne Glendening M.D. [facility] 11/08/2011, 2:30 PM Pager: [phone]

## 2011-11-08 NOTE — Op Note (Signed)
[facility] [address] [place], [place]  [address]  ENDOSCOPY PROCEDURE REPORT  PATIENT:  Terry Villa, Terry Villa  MR#:  [id number] BIRTHDATE:  February 12, 1946, 66 yrs. old  GENDER:  male  ENDOSCOPIST:  Iva Boop, MD, [facility] ASSISTANT:  Kandice Robinsons and Darral Dash, RN  PROCEDURE DATE:  11/08/2011 PROCEDURE:  EGD with biopsy, [name] Dilation of the Esophagus ASA CLASS:  Class III INDICATIONS:  1) dysphagia  MEDICATIONS:   There was residual sedation effect present from prior procedure., Fentanyl 12.5 mcg IV, Versed 1 mg IV TOPICAL ANESTHETIC:  Cetacaine Spray  DESCRIPTION OF PROCEDURE:   After the risks benefits and alternatives of the procedure were thoroughly explained, informed consent was obtained.  The Pentax Gastroscope [id number] endoscope was introduced through the mouth and advanced to the second portion of the duodenum, without limitations.  The instrument was slowly withdrawn as the mucosa was carefully examined. <<PROCEDUREIMAGES>>  An esophageal ring was found at the gastroesophageal junction. Subtle ring effect.  There were mucosal changes in the esophagus that could represent Barrett's esophagus. in the distal esophagus. Two columnar-like areas max 8-10 mm size just above Z-line. Multiple biopsies were obtained and sent to pathology.  The examination was otherwise normal.    Dilation was then performed at the gastroesphageal junction  1) Dilator:  [name]  Size(s):  54 French Resistance:  minimal  Heme:  none  COMPLICATIONS:  None  ENDOSCOPIC IMPRESSION: 1) Ring, esophageal at the gastroesophageal junction - dilated 54 Fr 2) Barrett's posssible in the distal esophagus - small areas biopsied 3) Otherwise normal examination. RECOMMENDATIONS: 1) Clear liquids until 1 PM then soft diet today - advance tomorrow if ok 2) If he has persistent dysphagia then would get an upper GI series as the scope did initially preferentially retroflex  in stomach and it was difficult to enter the duodenum - could represent paraesophageal hernia though I did not really identify a a hiatal hernia of sig size. 3) Await pathology results. 4) Daily PPI  Iva Boop, MD, [facility]  CC:  Melvia Heaps, MD  n. eSIGNED:   Iva Boop at 11/08/2011 11:43 AM  [facility]ild[facility] Laser, [phone]

## 2011-11-09 ENCOUNTER — Other Ambulatory Visit (HOSPITAL_COMMUNITY): Payer: Self-pay | Admitting: Pharmacy Technician

## 2011-11-09 ENCOUNTER — Encounter (HOSPITAL_COMMUNITY): Payer: Self-pay | Admitting: Emergency Medicine

## 2011-11-09 ENCOUNTER — Observation Stay (HOSPITAL_COMMUNITY)
Admission: EM | Admit: 2011-11-09 | Discharge: 2011-11-12 | Disposition: A | Discharge status: Skilled Nursing Facility | Payer: Medicare Other | Attending: Internal Medicine | Admitting: Internal Medicine

## 2011-11-09 DIAGNOSIS — I252 Old myocardial infarction: Secondary | ICD-10-CM | POA: Insufficient documentation

## 2011-11-09 DIAGNOSIS — N189 Chronic kidney disease, unspecified: Secondary | ICD-10-CM | POA: Insufficient documentation

## 2011-11-09 DIAGNOSIS — K219 Gastro-esophageal reflux disease without esophagitis: Secondary | ICD-10-CM | POA: Insufficient documentation

## 2011-11-09 DIAGNOSIS — Z0389 Encounter for observation for other suspected diseases and conditions ruled out: Secondary | ICD-10-CM

## 2011-11-09 DIAGNOSIS — I129 Hypertensive chronic kidney disease with stage 1 through stage 4 chronic kidney disease, or unspecified chronic kidney disease: Secondary | ICD-10-CM | POA: Insufficient documentation

## 2011-11-09 DIAGNOSIS — I2510 Atherosclerotic heart disease of native coronary artery without angina pectoris: Secondary | ICD-10-CM | POA: Insufficient documentation

## 2011-11-09 DIAGNOSIS — R531 Weakness: Secondary | ICD-10-CM

## 2011-11-09 DIAGNOSIS — Z8679 Personal history of other diseases of the circulatory system: Secondary | ICD-10-CM

## 2011-11-09 DIAGNOSIS — K5900 Constipation, unspecified: Secondary | ICD-10-CM | POA: Insufficient documentation

## 2011-11-09 DIAGNOSIS — I69998 Other sequelae following unspecified cerebrovascular disease: Principal | ICD-10-CM | POA: Insufficient documentation

## 2011-11-09 DIAGNOSIS — W19XXXA Unspecified fall, initial encounter: Secondary | ICD-10-CM

## 2011-11-09 DIAGNOSIS — R5381 Other malaise: Secondary | ICD-10-CM | POA: Insufficient documentation

## 2011-11-09 DIAGNOSIS — C61 Malignant neoplasm of prostate: Secondary | ICD-10-CM | POA: Diagnosis present

## 2011-11-09 DIAGNOSIS — Z79899 Other long term (current) drug therapy: Secondary | ICD-10-CM | POA: Insufficient documentation

## 2011-11-09 DIAGNOSIS — I10 Essential (primary) hypertension: Secondary | ICD-10-CM | POA: Diagnosis present

## 2011-11-09 DIAGNOSIS — Z85038 Personal history of other malignant neoplasm of large intestine: Secondary | ICD-10-CM | POA: Insufficient documentation

## 2011-11-09 DIAGNOSIS — Z9181 History of falling: Secondary | ICD-10-CM | POA: Insufficient documentation

## 2011-11-09 DIAGNOSIS — E785 Hyperlipidemia, unspecified: Secondary | ICD-10-CM | POA: Diagnosis present

## 2011-11-09 MED ORDER — OMEPRAZOLE 20 MG PO CPDR: 40.0000 mg | DELAYED_RELEASE_CAPSULE | Freq: Every day | ORAL | Status: DC

## 2011-11-09 NOTE — Progress Notes (Signed)
Clinical Social Work Psychiatry  Assessment    Presenting Symptoms/Problems: Patient admitted to hospital after being found down at home by a neighbor.  Consult for Psych is for capacity to make decisions regarding dc home with home health services or dc to SNF.  Patient reports he was in prison for 25 years and does not want to go to SNF because it will feel like prison to him again.  Reports good support with family and friends in the community and neighbors to check in on him.   Is not agreeable to SNF at this time, however is agreeable for [facility] PT/OT along with CSW to check in on him for continued care.    Psychiatric History: Patient reports he was in prison for 25 years, was released in 1999.  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  No previous diagnosis per chart review or from patient.  Reports he does have a PCP, but no other provider for mental health needs.      Family Collateral Information: No family for CSW to contact per patient report.  Reports his wife has passed away and he is a widow.  Reports on the day he fell at home he was playing cards with a friend having a few beers.  Reports he has neighbors to check in on him and also friends like mentioned above to also provide support. Reports he has daughters and a son whom he speaks with bu tries to not bother because he knows they have families and are busy.                              Emotional Health/Current Symptoms:     Suicide/Self harm: No past history and nothing present or current.      Attention/Behavioral/Psychotic Symptoms:Patient lying in bed when CSW came in to complete capacity assessment.  Patient very clear in thought and lucid when answering questions.  Was very clear about not wanting to go to rehab and was educated about benefits along with ST aspect to not feel as if he were going back to "a prison".  Patient refused, but is agreeable he would allow  home health to come into his home and provide care.   Patient was asked a series of questions regarding his memory and also capacity/cognition.  Patient was able answer questions but delayed in response and cognition is somewhat impaired.  He reports we are in May and in the 1300's.  Able to tell me what hospital he was at and his birthday, but both responses were delayed.     Patient mood and affect congruent being pleasant and calm/cooperative.  Patient reports good appetite and sleep at home, reports he uses a cane to walk with and struggles with a walker.  Reports no AVH, psychosis, Si, HI, or appears to be internally preoccupied. Patient has good eye contact and speech is normal.       Recent Loss/Stressor: declines.      Substance Abuse/Use: reports he use to smoke but quit and he drinks occassionally during the week. Does not report he has a problem.      Interpretive Summary/Anticipated DC Plan:   1.  After completing assessment, I feel patient has capacity to make his own decisions to go home versus going to a SNF.  I agree with physical therapy and I see a need for SNF to help increase  strength, but patient is very clear as to why he does not want to go and agreeable for other services to come in and provide care in the home.  Patient has the right to refuses services, and as it may be seen as a poor choice to providers it is still his choice and he is very clear. 2. Patient does not display any evidence of any psychosis or delusional thinking.  Patient very appropriate in affect and behaviors by answering "yes ma'am" and respectful. 3.  Patient has a friend who can pick him up this afternoon before 3:00 otherwise he will need help with transportation. 4.  Agreeable for home health in which CM should help with arrangements. 5.  Have discussed at length with Psych MD who will see patient this afternoon and follow up with disposition.  This patient was also discussed with treatment  team/attending MD.    Will follow up as needed.  Ashley Jacobs, MSW LCSW [phone]

## 2011-11-09 NOTE — Progress Notes (Signed)
PT WILL BE DC'D TO HOME WITH SELF CARE AFTER REFUSING SNF.  PSYCH WAS CALLED IN AND DETERMINES THAT THE HAS CAPACITY.  PT WILL BE DC'D TO HOME WITH HH PT/OT/RN/SW/RW.  PT WILL BE DC'D TO HOME VIA AMBULANCE TODAY. Terry Villa 11/09/2011 [phone] OR [phone]

## 2011-11-09 NOTE — Progress Notes (Signed)
PT Cancellation Note  Treatment cancelled today due to pt declined to participate with PT at this time.  Will try back as time allows.    Terry Villa, [place] [phone] 11/09/2011, 12:52 PM

## 2011-11-09 NOTE — Progress Notes (Signed)
Clinical Child psychotherapist (CSW) informed pt ready for dc and in need of a non emergency ambulance home. CSW contacted [facility] who also stated would be willing to transport pt walker. CSW confirmed pt address. CSW signing off.  Theresia Bough, MSW, Theresia Majors [phone]

## 2011-11-09 NOTE — ED Notes (Signed)
Per EMS pt was just discharged from [facility] today  Pt lives by himself and is unable to care for self refused to be sent to a home  Pt went home and consumed some alcohol then decided to come in tonight for placement  Pt was initially taken to [facility] due to difficulty walking was sent home with a walker and fell trying to use it
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
## 2011-11-09 NOTE — ED Provider Notes (Signed)
Medical screening examination/treatment/procedure(s) were performed by non-physician practitioner and as supervising physician I was immediately available for consultation/collaboration.  Brooklin Rieger R. Jeanmarie Mccowen, MD 11/09/11 2346 

## 2011-11-09 NOTE — ED Provider Notes (Signed)
History     CSN: [id number]  Arrival date & time 11/09/11  2001   First MD Initiated Contact with Patient 11/09/11 2159      Chief Complaint  Patient presents with  . Weakness    (Consider location/radiation/quality/duration/timing/severity/associated sxs/prior treatment) HPI Comments: The patient was admitted to, hospital on February 23 for generalized weakness.  He was assessed.  He attended physical therapy.  He had a psychiatric evaluation as well as labs and x-rays.  He was evaluated for nursing home placement, which she refused at that time and was discharged home today at 4:48 on arrival home.  He realized that he actually did need nursing home placement to increase his strength as he was unable to even ambulate in his own home with a walker so he called the ambulance and returned to her nursing home placement  Patient is a 66 y.o. male presenting with weakness. The history is provided by the patient.  Weakness Primary symptoms do not include headaches, dizziness, fever, nausea or vomiting. The symptoms are unchanged.  Additional symptoms include weakness.    Past Medical History  Diagnosis Date  . 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 DYSLIPIDEMIA 08/26/2009  . HYPERTENSION 08/29/2009  . GERD 08/29/2009  . RENAL DISEASE, CHRONIC 08/26/2009  . 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 CARCINOMA, COLON, HX OF 06/14/2010  . Personal history of colonic polyps 07/19/2010  . TOBACCO USE, QUIT 08/29/2009  . Stroke     Residual right upper and right lower extremity weakness.  . Myocardial infarction   . Shortness of breath   . Coronary artery disease     Past Surgical History  Procedure Date  . Liver biopsy   . Colon surgery     Family History  Problem Relation Age of Onset  . Breast cancer Mother   . Heart disease Father   . Clotting disorder Maternal Uncle     History  Substance Use Topics  . Smoking status: Former Smoker    Types: Cigarettes  . Smokeless  tobacco: Current User   Comment: Widowed, lives alone-but has lady friend. enjoys spending time with his 7 g-kids  . Alcohol Use: Yes     occasional      Review of Systems  Constitutional: Negative for fever and chills.  Respiratory: Negative for shortness of breath.   Cardiovascular: Negative for chest pain.  Gastrointestinal: Negative for nausea and vomiting.  Neurological: Positive for weakness. Negative for dizziness, speech difficulty, numbness and headaches.    Allergies  Review of patient's allergies indicates no known allergies.  Home Medications   Current Outpatient Rx  Name Route Sig Dispense Refill  . ACETAMINOPHEN 500 MG PO TABS Oral Take 500 mg by mouth every 6 (six) hours as needed. As needed for pain    . METOPROLOL TARTRATE 25 MG PO TABS Oral Take 25 mg by mouth 2 (two) times daily.    Marland Kitchen OMEPRAZOLE 20 MG PO CPDR Oral Take 2 capsules (40 mg total) by mouth daily. 60 capsule 3  . TAMSULOSIN HCL 0.4 MG PO CAPS Oral Take 0.4 mg by mouth at bedtime.    . TOPIRAMATE 100 MG PO TABS Oral Take 100 mg by mouth at bedtime.    Marland Kitchen VARDENAFIL HCL 10 MG PO TABS Oral Take 10 mg by mouth daily as needed. For E.D.      BP 104/57  Pulse 85  Temp(Src) 98.2 F (36.8 C) (Oral)  Resp 16  SpO2 98%  Physical Exam  Constitutional:  He is oriented to person, place, and time. He appears well-developed.  HENT:  Head: Normocephalic.  Eyes: Pupils are equal, round, and reactive to light.  Neck: Normal range of motion.  Cardiovascular: Normal rate.   Pulmonary/Chest: Effort normal.  Abdominal: Soft.  Musculoskeletal: Normal range of motion.  Neurological: He is alert and oriented to person, place, and time.  Skin: Skin is warm and dry.  Psychiatric: He has a normal mood and affect.    ED Course  Procedures (including critical care time)  Labs Reviewed - No data to display No results found.   No diagnosis found.  Spoke with Dr. Rubin Payor.  I reviewed patient's history and  plan  MDM  Spoke with Dr. Haroldine Laws, who agrees that we do not need to repeat labs, x-rays, patient will be readmitted to the hospital to continue [facility] for nursing home placement to help him increase his strength so that he can return to his        Arman Filter, NP 11/09/11 2249  Arman Filter, NP 11/09/11 2250

## 2011-11-09 NOTE — Progress Notes (Signed)
     [name] 11/09/2011, 9:04 AM  SUBJECTIVE:       C/O pain at IV site on right forearm.  Some blood with stool last PM.  OBJECTIVE:        General: deconditioned, weak     Vital signs in last 24 hours:    Temp:  [97.3 F (36.3 C)-97.9 F (36.6 C)] 97.6 F (36.4 C) (03/01 0421) Pulse Rate:  [49-70] 60  (03/01 0421) Resp:  [6-53] 18  (03/01 0421) BP: (92-149)/(45-90) 117/53 mmHg (03/01 0421) SpO2:  [96 %-100 %] 97 % (03/01 0421) Last BM Date: 11/08/11  Heart: RRR  Chest: clear B Abdomen: soft, NT, ND.  Active BS  Extremities: no pedal edema.   Neuro/Psych:  Pleasant, not confused.  Quite weak.  Needs help to sit up and to transfer to [facility] commode.   Intake/Output from previous day: 02/28 0701 - 03/01 0700 In: 740 [P.O.:240; I.V.:500] Out: 1000 [Urine:1000]  Intake/Output this shift: Total I/O In: 480 [P.O.:480] Out: -   Lab Results:  Basename 11/08/11 0510 11/07/11 1913 11/07/11 1413 11/07/11 0615  WBC 4.8 4.3 -- 4.8  HGB 12.0* 12.2* 11.9* --  HCT 34.6* 34.8* 34.0* --  PLT 230 242 -- 229   BMET  Basename 11/07/11 0615  NA 140  K 3.9  CL 109  CO2 22  GLUCOSE 98  BUN 15  CREATININE 1.08  CALCIUM 9.1   PT/INR  Basename 11/07/11 1915  LABPROT 12.6  INR 0.92   ASSESMENT: 1.  GI Bleed. Radiation proctitis treated with laser, sigmoid diverticulosis on colonoscopy yesterday. 2.  Dysphagia:  EGD yesterday:  Possible Barrett's esophagus (bx pending).   Esophageal ring was dilated 3.  Adenomatous colon polyp 2011, none seen on yesterday's scope.  PLAN: 1.  I made ROV with Dr Arlyce Dice for 3/25. 2.  Will order IV removed per request of pt.  No IV meds or IVF on med list.  LOS: 6 days   Terry Villa  11/09/2011, 9:04 AM Pager: [phone]

## 2011-11-09 NOTE — Progress Notes (Signed)
Patient ID: Terry Villa    ZOX:[id number]    DOB: [birth year]    DOA: 11/03/2011  PCP: Rene Paci, MD, MD  Subjective: Complaining of pain in the right forearm secondary to IV.   Objective: Weight change:   Intake/Output Summary (Last 24 hours) at 11/09/11 1037 Last data filed at 11/09/11 0940  Gross per 24 hour  Intake   1220 ml  Output   1250 ml  Net    -30 ml   Blood pressure 117/53, pulse 60, temperature 97.6 F (36.4 C), temperature source Oral, resp. rate 18, height 5\' 8"  (1.727 m), weight 60.6 kg (133 lb 9.6 oz), SpO2 97.00%.  Physical Exam: General: Alert and awake, oriented, not in any acute distress. HEENT: anicteric sclera, pupils reactive to light and accommodation, EOMI CVS: S1-S2 clear, no murmur rubs or gallops Chest: clear to auscultation bilaterally, no wheezing, rales or rhonchi Abdomen: soft nontender, nondistended, normal bowel sounds, no organomegaly Extremities: no cyanosis, clubbing or edema noted bilaterally Neuro: Cranial nerves II-XII intact, no focal neurological deficits  Lab Results: Basic Metabolic Panel:  Lab 11/07/11 8119 11/06/11 0527  NA 140 135  K 3.9 3.7  CL 109 106  CO2 22 20  GLUCOSE 98 97  BUN 15 14  CREATININE 1.08 1.02  CALCIUM 9.1 9.1  MG -- --  PHOS -- --   Liver Function Tests:  Lab 11/03/11 1245  AST 39*  ALT 23  ALKPHOS 83  BILITOT 0.6  PROT 6.4  ALBUMIN 3.6   CBC:  Lab 11/08/11 0510 11/07/11 1913 11/03/11 1245  WBC 4.8 4.3 --  NEUTROABS -- -- 7.3  HGB 12.0* 12.2* --  HCT 34.6* 34.8* --  MCV 96.4 96.1 --  PLT 230 242 --   Cardiac Enzymes:  Lab 11/05/11 0600 11/04/11 0505 11/03/11 1245  CKTOTAL 283* 538* 1025*  CKMB 7.8* 15.9* 35.5*  CKMBINDEX -- -- --  TROPONINI <0.30 -- <0.30   BNP: No components found with this basename: POCBNP:2 CBG:  Lab 11/06/11 2047  GLUCAP 126*     Micro Results: Recent Results (from the past 240 hour(s))  CLOSTRIDIUM DIFFICILE BY PCR     Status: Normal   Collection Time   11/07/11  3:38 PM      Component Value Range Status Comment   C difficile by pcr NEGATIVE  NEGATIVE  Final     Studies/Results: Ct Head Wo Contrast  11/03/2011 IMPRESSION:  1.  No acute intracranial abnormality. 2.  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 3.  Stable mild to moderate generalized atrophy and mild chronic microvascular ischemic changes of the white matter.  Original Report Authenticated By: Arnell Sieving, M.D.    Medications: Scheduled Meds:    . metoprolol tartrate  25 mg Oral BID  . pantoprazole  40 mg Oral Q0600  . Tamsulosin HCl  0.4 mg Oral QHS  . topiramate  100 mg Oral QHS  . 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 - Patient lives alone, was admitted with a fall, was lying on the floor onto the patient was found by a neighbor, hence unsafe situation for the patient, called psychiatry evaluation for capacity.  DVT Prophylaxis:Bilateral SCDs  Code Status:Full code   Disposition: Await psychiatry recommendations regarding capacity. Although patient does have unsafe situation at home by being alone and ambulation issues, he states that he does not want to go to any facility as it feels like a 'prison', he wants to enjoy his freedom being with his neighbors and friends. He states that he had been in prison before, hence he knows how it  feels. I also talked to patient's neighbor, Ms. Domenica Fail [phone]), who states that he's able to manage somewhat, has prepared meals and she helps him with the bills, groceries or bank etc. but he does have issues with the ambulation, uses a cane. Patient is willing to have home PT, OT, home health aide, RN social worker etc all the resources he can have. I will await psychiatry recommendations, patient may be discharged home today with all the above resources mentioned by case management.     LOS: 6 days   Elissa Grieshop M.D. [facility] 11/09/2011, 10:37 AM Pager: [phone]

## 2011-11-09 NOTE — Progress Notes (Addendum)
Covering Clinical Social Worker (CSW) visited pt room to obtain an emergency contact. CSW has been made aware from Psych CSW that pt appears to have capacity and at this moment the plan remains [facility] PT/OT. CSW available if additional assistance needed. Pt reports his main contact person should be Falon Flinchum Fredrickson [phone] who is pt neighbor. Pt states he does not know who "Jason Nest" is on his emergency contact.  Theresia Bough, MSW, Theresia Majors [phone]

## 2011-11-09 NOTE — ED Notes (Signed)
Pt states that he fell and was seen at [facility].  Was sent home but is unable to care for himself.  "I need to go somewhere where I can get some help"

## 2011-11-10 ENCOUNTER — Encounter (HOSPITAL_COMMUNITY): Payer: Self-pay | Admitting: Family Medicine

## 2011-11-10 DIAGNOSIS — W19XXXA Unspecified fall, initial encounter: Secondary | ICD-10-CM

## 2011-11-10 MED ORDER — ACETAMINOPHEN 500 MG PO TABS
500.0000 mg | ORAL_TABLET | Freq: Four times a day (QID) | ORAL | Status: DC | PRN
  Administered 2011-11-10 – 2011-11-12 (×4): 500 mg via ORAL
  Filled 2011-11-10 (×3): qty 1
  Filled 2011-11-10: qty 2

## 2011-11-10 MED ORDER — PANTOPRAZOLE SODIUM 40 MG PO TBEC
40.0000 mg | DELAYED_RELEASE_TABLET | Freq: Every day | ORAL | Status: DC
  Administered 2011-11-10 – 2011-11-12 (×3): 40 mg via ORAL
  Filled 2011-11-10 (×3): qty 1

## 2011-11-10 MED ORDER — TOPIRAMATE 100 MG PO TABS
100.0000 mg | ORAL_TABLET | Freq: Every day | ORAL | Status: DC
  Administered 2011-11-10 – 2011-11-11 (×2): 100 mg via ORAL
  Filled 2011-11-10 (×3): qty 1

## 2011-11-10 MED ORDER — METOPROLOL TARTRATE 25 MG PO TABS
25.0000 mg | ORAL_TABLET | Freq: Two times a day (BID) | ORAL | Status: DC
  Administered 2011-11-10 – 2011-11-12 (×5): 25 mg via ORAL
  Filled 2011-11-10 (×6): qty 1

## 2011-11-10 MED ORDER — TAMSULOSIN HCL 0.4 MG PO CAPS
0.4000 mg | ORAL_CAPSULE | Freq: Every day | ORAL | Status: DC
  Administered 2011-11-10 – 2011-11-11 (×2): 0.4 mg via ORAL
  Filled 2011-11-10 (×3): qty 1

## 2011-11-10 NOTE — Progress Notes (Signed)
Report called to Debra,RN on 3rd floor. Pt currently resting. VS stable.

## 2011-11-10 NOTE — H&P (Addendum)
PCP:   Rene Paci, MD, MD   Chief Complaint:  fall  HPI: This is a 66 year old gentleman who was just discharged from [facility] today. He was admitted there with weakness, fall and rhabdomyolysis. He qualified for nursing home placement but adamantly refused. Psychiatry was consulted, with a question of capacity to make decisions - specifically his discharge home. He was found to have full capacity. He was discharged home with home PT. Not long after arriving home he fell, realizing he could not take care of himself at home he called 911 came back to the ER at [facility] long requesting placement.  Review of Systems:  The patient denies anorexia, fever, weight loss,, vision loss, decreased hearing, hoarseness, chest pain, syncope, dyspnea on exertion, peripheral edema, balance deficits, hemoptysis, abdominal pain, melena, hematochezia, severe indigestion/heartburn, hematuria, incontinence, genital sores, muscle weakness, suspicious skin lesions, transient blindness, difficulty walking, depression, unusual weight change, abnormal bleeding, enlarged lymph nodes, angioedema, and breast masses.  Past Medical History: Past Medical History  Diagnosis Date  . 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 DYSLIPIDEMIA 08/26/2009  . HYPERTENSION 08/29/2009  . GERD 08/29/2009  . RENAL DISEASE, CHRONIC 08/26/2009  . 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 CARCINOMA, COLON, HX OF 06/14/2010  . Personal history of colonic polyps 07/19/2010  . TOBACCO USE, QUIT 08/29/2009  . Stroke     Residual right upper and right lower extremity weakness.  . Myocardial infarction   . Shortness of breath   . Coronary artery disease    Past Surgical History  Procedure Date  . Liver biopsy   . Colon surgery     Medications: Prior to Admission medications   Medication Sig Start Date End Date Taking? Authorizing Provider  acetaminophen (TYLENOL) 500 MG tablet Take 500 mg by mouth every 6 (six) hours as needed. As  needed for pain   Yes Historical Provider, MD  metoprolol tartrate (LOPRESSOR) 25 MG tablet Take 25 mg by mouth 2 (two) times daily.   Yes Historical Provider, MD  omeprazole (PRILOSEC) 20 MG capsule Take 2 capsules (40 mg total) by mouth daily. 11/09/11  Yes Ripudeep Jenna Luo, MD  Tamsulosin HCl (FLOMAX) 0.4 MG CAPS Take 0.4 mg by mouth at bedtime.   Yes Historical Provider, MD  topiramate (TOPAMAX) 100 MG tablet Take 100 mg by mouth at bedtime.   Yes Historical Provider, MD  vardenafil (LEVITRA) 10 MG tablet Take 10 mg by mouth daily as needed. For E.D.   Yes Historical Provider, MD    Allergies:  No Known Allergies  Social History:  reports that he has quit smoking. His smoking use included Cigarettes. He uses smokeless tobacco. He reports that he drinks alcohol. He reports that he does not use illicit drugs.  Family History: Family History  Problem Relation Age of Onset  . Breast cancer Mother   . Heart disease Father   . Clotting disorder Maternal Uncle     Physical Exam: Filed Vitals:   11/09/11 2008 11/09/11 2257  BP: 104/57 115/55  Pulse: 85 72  Temp: 98.2 F (36.8 C) 98.6 F (37 C)  TempSrc: Oral Oral  Resp: 16   SpO2: 98% 97%    General:  Alert and oriented times three, well developed and nourished, no acute distress Eyes: PERRLA, pink conjunctiva, no scleral icterus ENT: Moist oral mucosa, neck supple, no thyromegaly Lungs: clear to ascultation, no wheeze, no crackles, no use of accessory muscles Cardiovascular: regular rate and rhythm, no regurgitation, no gallops, no murmurs. No  carotid bruits, no JVD Abdomen: soft, positive BS, non-tender, non-distended, no organomegaly, not an acute abdomen GU: not examined Neuro: CN II - XII grossly intact, sensation intact Musculoskeletal: strength 3/5 left lower extremity extremities, no clubbing, cyanosis or edema Skin: no rash, no subcutaneous crepitation, no decubitus Psych: appropriate patient   Labs on Admission:    [facility] 11/07/11 0615  NA 140  K 3.9  CL 109  CO2 22  GLUCOSE 98  BUN 15  CREATININE 1.08  CALCIUM 9.1  MG --  PHOS --   No results found for this basename: AST:2,ALT:2,ALKPHOS:2,BILITOT:2,PROT:2,ALBUMIN:2 in the last 72 hours No results found for this basename: LIPASE:2,AMYLASE:2 in the last 72 hours  Basename 11/08/11 0510 11/07/11 1913  WBC 4.8 4.3  NEUTROABS -- --  HGB 12.0* 12.2*  HCT 34.6* 34.8*  MCV 96.4 96.1  PLT 230 242   No results found for this basename: CKTOTAL:3,CKMB:3,CKMBINDEX:3,TROPONINI:3 in the last 72 hours No components found with this basename: POCBNP:3 No results found for this basename: DDIMER:2 in the last 72 hours No results found for this basename: HGBA1C:2 in the last 72 hours No results found for this basename: CHOL:2,HDL:2,LDLCALC:2,TRIG:2,CHOLHDL:2,LDLDIRECT:2 in the last 72 hours No results found for this basename: TSH,T4TOTAL,FREET3,T3FREE,THYROIDAB in the last 72 hours No results found for this basename: VITAMINB12:2,FOLATE:2,FERRITIN:2,TIBC:2,IRON:2,RETICCTPCT:2 in the last 72 hours  Micro Results: Recent Results (from the past 240 hour(s))  CLOSTRIDIUM DIFFICILE BY PCR     Status: Normal   Collection Time   11/07/11  3:38 PM      Component Value Range Status Comment   C difficile by pcr NEGATIVE  NEGATIVE  Final      Radiological Exams on Admission: No results found.  Assessment/Plan Present on Admission:  Fall/weakness Left lower extremity weakness - chronic And reason observation status Consult social worker with question of placement. As patient was just hospitalize and had qualified for placement it is my understanding that this will still reapply with this readmission Physical therapy consult to  .ADENOCARCINOMA, PROSTATE .CARCINOMA, COLON, HX OF .DYSLIPIDEMIA .HYPERTENSION Stable  Full code DVT prophylaxis Team 1/Dr. Sammuel Cooper, Bevan Disney 11/10/2011, 12:12 AM

## 2011-11-10 NOTE — Progress Notes (Signed)
CSW received referral from Dr. Ardyth Harps that patient was discharged from [facility] yesterday, 3/1 but is now agreeable to SNF placement. CSW completed FL2 and faxed out to [facility], provided patient with list of facilities & will follow-up Monday with bed offers.   Unice Bailey, LCSWA [phone]

## 2011-11-10 NOTE — Progress Notes (Signed)
Patient admitted earlier today after he had just been discharged from [facility] the same day. At the time PT evals had recommended SNF which he refused. Psych consult was called in for competency and it was determined that he does have decision-making capacity. As soon as he got home from the hospita,l had a fall at home, reconsidered his decision for SNF and called 911 to bring him back to the hospital. SW aware. Patient has no active ongoing medical issues.

## 2011-11-10 NOTE — Progress Notes (Signed)
Agree with Ms. Gribbin's assessment and plan. Rena Sweeden E. Merle Cirelli, MD, FACG  

## 2011-11-11 MED ORDER — SENNOSIDES-DOCUSATE SODIUM 8.6-50 MG PO TABS
2.0000 | ORAL_TABLET | ORAL | Status: DC | PRN
  Administered 2011-11-11: 2 via ORAL
  Filled 2011-11-11: qty 2

## 2011-11-11 MED ORDER — POLYETHYLENE GLYCOL 3350 17 G PO PACK
17.0000 g | PACK | Freq: Every day | ORAL | Status: DC
  Administered 2011-11-11 – 2011-11-12 (×2): 17 g via ORAL
  Filled 2011-11-11 (×2): qty 1

## 2011-11-11 NOTE — Progress Notes (Signed)
Subjective: Wants something for constipation.  Objective: Vital signs in last 24 hours: Temp:  [97.9 F (36.6 C)-98.6 F (37 C)] 97.9 F (36.6 C) (03/03 0605) Pulse Rate:  [57-69] 61  (03/03 1007) Resp:  [18] 18  (03/03 0605) BP: (113-123)/(64-76) 119/70 mmHg (03/03 1007) SpO2:  [98 %] 98 % (03/03 0605) Weight change:  Last BM Date: 11/11/11  Intake/Output from previous day: 03/02 0701 - 03/03 0700 In: 680 [P.O.:680] Out: 1075 [Urine:1075]     Physical Exam: Deferred as no active medical issues.    Recent Results (from the past 240 hour(s))  CLOSTRIDIUM DIFFICILE BY PCR     Status: Normal   Collection Time   11/07/11  3:38 PM      Component Value Range Status Comment   C difficile by pcr NEGATIVE  NEGATIVE  Final     Studies/Results: No results found.  Medications: Scheduled Meds:   . metoprolol tartrate  25 mg Oral BID  . pantoprazole  40 mg Oral Q1200  . polyethylene glycol  17 g Oral Daily  . Tamsulosin HCl  0.4 mg Oral QHS  . topiramate  100 mg Oral QHS   Continuous Infusions:  PRN Meds:.acetaminophen, senna-docusate  Assessment/Plan:  Active Problems:  ADENOCARCINOMA, PROSTATE  DYSLIPIDEMIA  HYPERTENSION  CARCINOMA, COLON, HX OF  CEREBROVASCULAR ACCIDENT, HX OF  Fall   #1 Falls with generalized weakness: waiting on SNF bed. No active medical issues. Returned to the hospital same day after being discharged. Had refused SNF but went home and had a fall, prompting his return and asking for placement. SW aware.  #2 Constipation: Will add miralax and senna prn.   LOS: 2 days   [facility] Pager: [phone] 11/11/2011, 11:14 AM

## 2011-11-12 MED ORDER — POLYETHYLENE GLYCOL 3350 17 G PO PACK: 17.0000 g | PACK | Freq: Every day | ORAL | Status: AC

## 2011-11-12 NOTE — Progress Notes (Signed)
UR complete 

## 2011-11-12 NOTE — Progress Notes (Signed)
Patient set to discharge to [facility] SNF today. Patient in agreement & looking forward to rehabilitation. PTAR called for transportation.   Unice Bailey, LCSWA [phone]

## 2011-11-12 NOTE — Evaluation (Signed)
Physical Therapy Evaluation Patient Details Name: Terry Villa MRN: [id number] DOB: 1946-06-12 Today's Date: 11/12/2011   Pt needs +2 tot assist pt = 50% for mobility and transfers.  Recommend continued PT at SNF    Problem List:  Patient Active Problem List  Diagnoses  . ADENOCARCINOMA, PROSTATE  . DYSLIPIDEMIA  . HYPOKALEMIA  . HYPERTENSION  . GERD  . CONSTIPATION  . RENAL DISEASE, CHRONIC  . ERECTILE DYSFUNCTION, ORGANIC  . CARCINOMA, COLON, HX OF  . CEREBROVASCULAR ACCIDENT, HX OF  . PERSONAL HISTORY OF COLONIC POLYPS  . TOBACCO USE, QUIT  . Lower GI bleed  . Dysphagia  . Radiation proctitis  . Fall    Past Medical History:  Past Medical History  Diagnosis Date  . 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 DYSLIPIDEMIA 08/26/2009  . HYPERTENSION 08/29/2009  . GERD 08/29/2009  . RENAL DISEASE, CHRONIC 08/26/2009  . 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 CARCINOMA, COLON, HX OF 06/14/2010  . Personal history of colonic polyps 07/19/2010  . TOBACCO USE, QUIT 08/29/2009  . Stroke     Residual right upper and right lower extremity weakness.  . Myocardial infarction   . Shortness of breath   . Coronary artery disease    Past Surgical History:  Past Surgical History  Procedure Date  . Liver biopsy   . Colon surgery     PT Assessment/Plan/Recommendation PT Assessment Clinical Impression Statement: Pt with readmission after unsuccessful attempt at self care at home.  Today, he has more difficutly with decreased ROM and strength in right side form old CVA.  He was unable to walk, but did pivot from bed to [facility], then to chair. He wanted to use cane,but needed +2 for assist of balance and cane was not useful to him.  He did not want to use the RW, but did agree to stand with it for trunk extension stretching and some AAROM at right hip. Pt would benefit from continued PT at SNF level for improve functional independence and safety awareness PT Recommendation/Assessment:  Patient will need skilled PT in the acute care venue PT Problem List: Decreased strength;Decreased range of motion;Decreased activity tolerance;Decreased balance;Decreased mobility;Decreased coordination;Decreased safety awareness;Decreased knowledge of use of DME;Decreased knowledge of precautions;Decreased cognition;Impaired tone Barriers to Discharge: Decreased caregiver support PT Therapy Diagnosis : Difficulty walking;Abnormality of gait;Generalized weakness PT Plan PT Frequency: Min 3X/week PT Treatment/Interventions: DME instruction;Gait training;Therapeutic activities;Functional mobility training;Balance training;Patient/family education PT Recommendation Recommendations for Other Services: OT consult Follow Up Recommendations: Skilled nursing facility Equipment Recommended: Defer to next venue PT Goals  Acute Rehab PT Goals PT Goal Formulation: With patient Time For Goal Achievement: 2 weeks Pt will go Supine/Side to Sit: with supervision PT Goal: Supine/Side to Sit - Progress: Goal set today Pt will go Sit to Supine/Side: with supervision PT Goal: Sit to Supine/Side - Progress: Goal set today Pt will go Sit to Stand: with supervision PT Goal: Sit to Stand - Progress: Goal set today Pt will go Stand to Sit: with supervision PT Goal: Stand to Sit - Progress: Goal set today Pt will Transfer Bed to Chair/Chair to Bed: with supervision PT Transfer Goal: Bed to Chair/Chair to Bed - Progress: Goal set today Pt will Ambulate: 16 - 50 feet;with least restrictive assistive device;with supervision PT Goal: Ambulate - Progress: Goal set today Pt will Perform Home Exercise Program: Independently PT Goal: Perform Home Exercise Program - Progress: Goal set today  PT Evaluation Precautions/Restrictions  Precautions Precautions: Fall Required Braces or Orthoses: No Restrictions  Weight Bearing Restrictions: No Prior Functioning  Home Living Lives With: Other (Comment) Receives Help  From:  (pt plans to go to SNF for rehab) Prior Function Comments: pt unable to care for self at this time Cognition Cognition Arousal/Alertness: Awake/alert Overall Cognitive Status: Appears within functional limits for tasks assessed Orientation Level: Oriented X4 Safety/Judgement: Decreased awareness of safety precautions;Decreased safety judgement for tasks assessed Decreased Safety/Judgement: Decreased awareness of need for assistance Awareness of Errors: Decreased awareness of errors made Decreased Awareness of Errors: Assistance required to correct errors made;Assistance required to identify errors made Awareness of Deficits: Decreased awareness of deficits Awareness of Deficits - Other Comments: pt wants to use a straight cane for mobility, but it does not provide enough assistance Problem Solving: Requires assistance for problem solving Sensation/Coordination Coordination Gross Motor Movements are Fluid and Coordinated: Yes (in left UE and LE) Extremity Assessment RLE Assessment RLE Assessment: Exceptions to [facility] RLE AROM (degrees) RLE Overall AROM Comments: flexion contracture in R LE, decreased ROM in hip, knee and ankle over all  LLE Assessment LLE Assessment: Within Functional Limits Mobility (including Balance) Bed Mobility Bed Mobility: Yes Supine to Sit: 4: Min assist Supine to Sit Details (indicate cue type and reason): pt with motor and ROM impairment in right UE and LE from old CVA. He needs manual assist to get to EOB Sitting - Scoot to Edge of Bed: 4: Min assist Transfers Transfers: Yes Sit to Stand: 3: Mod assist;From elevated surface;With upper extremity assist;From bed;From chair/3-in-1 Sit to Stand Details (indicate cue type and reason): pt has difficulty with using right leg at all for assistance to balance.  It is drawn up into flexion and balance is impaired Stand to Sit: 3: Mod assist;To chair/3-in-1 Squat Pivot Transfers: 1: +2 Total assist;Other  (comment) (pt ~50%) Squat Pivot Transfer Details (indicate cue type and reason): pt need +2 assist for balance and to dirct left arm to reach for armrest so he can pivot to 3-in-1/chair Ambulation/Gait Ambulation/Gait: No Assistive device: Rolling walker;Straight cane Stairs: No Wheelchair Mobility Wheelchair Mobility: No  Posture/Postural Control Posture/Postural Control: Postural limitations Postural Limitations: pt with abnormal tone influences from old CVA in right UE and LE Balance Balance Assessed: No Dynamic Sitting Balance Dynamic Sitting - Balance Support: No upper extremity supported;Feet supported Dynamic Sitting - Level of Assistance: 4: Min assist Exercise    End of Session PT - End of Session Equipment Utilized During Treatment: Gait belt Activity Tolerance: Patient limited by fatigue Patient left: in chair;with call bell in reach Nurse Communication: Mobility status for transfers;Mobility status for ambulation General Behavior During Session: [facility] for tasks performed Cognition: [facility] for tasks performed  Donnetta Hail 11/12/2011, 12:40 PM

## 2011-11-12 NOTE — Discharge Summary (Signed)
Physician Discharge Summary  Patient ID: Terry Villa MRN: [id number] DOB/AGE: [birth year] 66 y.o.  Admit date: 11/09/2011 Discharge date: 11/12/2011  Primary Care Physician:  Rene Paci, MD, MD   Discharge Diagnoses:    Active Problems:  ADENOCARCINOMA, PROSTATE  DYSLIPIDEMIA  HYPERTENSION  CARCINOMA, COLON, HX OF  CEREBROVASCULAR ACCIDENT, HX OF  Fall    Medication List  As of 11/12/2011  9:49 AM   STOP taking these medications         vardenafil 10 MG tablet         TAKE these medications         acetaminophen 500 MG tablet   Commonly known as: TYLENOL   Take 500 mg by mouth every 6 (six) hours as needed. As needed for pain      metoprolol tartrate 25 MG tablet   Commonly known as: LOPRESSOR   Take 25 mg by mouth 2 (two) times daily.      omeprazole 20 MG capsule   Commonly known as: PRILOSEC   Take 2 capsules (40 mg total) by mouth daily.      polyethylene glycol packet   Commonly known as: MIRALAX / GLYCOLAX   Take 17 g by mouth daily.      Tamsulosin HCl 0.4 MG Caps   Commonly known as: FLOMAX   Take 0.4 mg by mouth at bedtime.      topiramate 100 MG tablet   Commonly known as: TOPAMAX   Take 100 mg by mouth at bedtime.             Disposition and Follow-up:  To be discharged to SNF today in stable condition.  Consults:  None    Significant Diagnostic Studies:  No results found.  Brief H and P: For complete details please refer to admission H and P, but in brief this is a 66 year old gentleman who was just discharged from [facility] today. He was admitted there with weakness, fall and rhabdomyolysis. He qualified for nursing home placement but adamantly refused. Psychiatry was consulted, with a question of capacity to make decisions - specifically his discharge home. He was found to have full capacity. He was discharged home with home PT. Not long after arriving home he fell, realizing he could not take care of himself at home he called 911  came back to the ER at [facility] long requesting placement.     Hospital Course:  Active Problems:  ADENOCARCINOMA, PROSTATE  DYSLIPIDEMIA  HYPERTENSION  CARCINOMA, COLON, HX OF  CEREBROVASCULAR ACCIDENT, HX OF  Fall   #1 Fall/weakness: Has a bed at SNF today. Will discharge. No active medical problems this hospitalization.   Time spent on Discharge: Less than 30 minutes.  SignedChaya Jan [facility] Pager: [phone] 11/12/2011, 9:49 AM

## 2011-11-14 ENCOUNTER — Encounter: Payer: Self-pay | Admitting: Internal Medicine

## 2011-11-14 NOTE — Progress Notes (Signed)
Quick Note:  Inflammation - reflux - no Barrett's No recall Letter created ______

## 2011-11-15 NOTE — Discharge Summary (Signed)
Physician Discharge Summary   Please note that this discharge summary is for the admission to 11/03/2011  to 11/09/2011. Patient was discharged home on 11/09/2011.  Patient ID: Terry Villa MRN: [id number] DOB/AGE: 66/24/1947 66 y.o.  Admit date: 11/03/2011 Discharge date: 11/09/2011  Primary Care Physician:  Rene Paci, MD, MD  Discharge Diagnoses:    .Lower GI bleed, diverticular  .Dysphagia secondary to recent visual ring, dilated on EGD  .Radiation proctitis  generalized deconditioning Rhabdomyolysis Hypokalemia  Consults:  1. Gastroenterology, Dr. Leone Payor                    2. psychiatry, Dr. Ferol Luz   Discharge Medications: Medication List  As of 11/15/2011  7:58 AM   STOP taking these medications         aspirin 325 MG EC tablet      lactulose 10 GM/15ML solution      vardenafil 10 MG tablet         TAKE these medications         metoprolol tartrate 25 MG tablet   Commonly known as: LOPRESSOR   Take 25 mg by mouth 2 (two) times daily.      omeprazole 20 MG capsule   Commonly known as: PRILOSEC   Take 2 capsules (40 mg total) by mouth daily.      Tamsulosin HCl 0.4 MG Caps   Commonly known as: FLOMAX   Take 0.4 mg by mouth at bedtime.      topiramate 100 MG tablet   Commonly known as: TOPAMAX   Take 100 mg by mouth at bedtime.             Brief H and P: For complete details please refer to admission H and P, but in brief patient is a 66 year old male who presented to the emergency department with a fall on the day of admission on 11/03/2011. Patient was in usual state of health, walking with a cane when he suddenly became generally weak in both legs and fell to the floor. He had no loss of consciousness or any head injury. He was admitted for further workup.  Hospital Course: Patient was admitted with syncopal episode versus TIA. 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 2-D  echocardiogram showed EF of 60-65% with normal wall motion. Patient was started on aspirin during hospitalization for possible TIA.  Lower GI bleed possibly diverticular bleeding, painless: On 11/07/2011 patient will frankly bloody stools which prompted GI consultation. Patient underwent EGD and colonoscopy. Aspirin and Lovenox were discontinued, patient was placed on SCDs and Protonix. 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 Coloscopy showed radiation proctitis in the rectum with moderate diverticulosis in the sigmoid colon. Patient was placed on dysphagia 3 diet and had been tolerating without difficulty.   Dysphagia: Chronic, likely secondary to esophageal ring, dilated   Generalized weakness: Patient had physical therapy evaluation done and recommended skilled nursing facility placement as patient had significant generalized weakness and risk of falls. Especially with his unsafe situation and patient was lying on the floor when he was found by the neighbor, patient however adamantly refused SNF, psychiatry service was consulted for evaluation. He was deemed to be competent to make his own decisions and patient was then discharged home. Home resources through the case management, home PT, OT, RN, social worker visit was arranged. His neighbor, Ms Domenica Fail also was  alerted on the phone by myself to call if patient was not doing well.   Day of Discharge BP 113/63  Pulse 60  Temp(Src) 97.9 F (36.6 C) (Oral)  Resp 20  Ht 5\' 8"  (1.727 m)  Wt 60.6 kg (133 lb 9.6 oz)  BMI 20.31 kg/m2  SpO2 98%  Physical Exam: General: Alert and awake oriented x3 not in any acute distress. HEENT: anicteric sclera, pupils reactive to light and accommodation CVS: S1-S2 clear no murmur rubs or gallops Chest: clear to auscultation bilaterally, no wheezing rales or rhonchi Abdomen: soft nontender, nondistended, normal bowel sounds, no organomegaly Extremities:  no cyanosis, clubbing or edema noted bilaterally Neuro: Cranial nerves II-XII intact, no focal neurological deficits   The results of significant diagnostics from this hospitalization (including imaging, microbiology, ancillary and laboratory) are listed below for reference.       Disposition and Follow-up: Discharge Orders    Future Appointments: Provider: Department: Dept Phone: Center:   12/03/2011 1:45 PM Louis Meckel, MD [facility] [phone] [facility]     Future Orders Please Complete By Expires   Diet - low sodium heart healthy      Increase activity slowly          DISPOSITION: Home  DIET: Dysphagia 3 diet  ACTIVITY: As tolerated   DISCHARGE FOLLOW-UP Follow-up Information    Follow up with Melvia Heaps, MD on 12/03/2011. (1:45 PM He is your GI doctor.)    Contact information:   [address] [facility] [address] [place] [address] [place] [address] [phone]       Follow up with Rene Paci, MD. Schedule an appointment as soon as possible for a visit in 2 weeks.   Contact information:   [address] [facility] [address] [place] [address] [phone]          Time spent on Discharge: 35 minutes  Signed:  RAI,RIPUDEEP M.D. [facility] 11/15/2011, 7:58 AM

## 2011-12-03 ENCOUNTER — Ambulatory Visit: Payer: Medicare Other | Admitting: Gastroenterology

## 2012-02-07 ENCOUNTER — Other Ambulatory Visit: Payer: Self-pay | Admitting: Gastroenterology

## 2012-02-29 ENCOUNTER — Encounter: Payer: Self-pay | Admitting: Gastroenterology

## 2012-02-29 ENCOUNTER — Ambulatory Visit (INDEPENDENT_AMBULATORY_CARE_PROVIDER_SITE_OTHER): Payer: Medicare Other | Admitting: Gastroenterology

## 2012-02-29 VITALS — BP 124/78 | HR 64 | Ht 66.0 in | Wt 124.0 lb

## 2012-02-29 DIAGNOSIS — K627 Radiation proctitis: Secondary | ICD-10-CM

## 2012-02-29 DIAGNOSIS — K6289 Other specified diseases of anus and rectum: Secondary | ICD-10-CM

## 2012-02-29 DIAGNOSIS — K922 Gastrointestinal hemorrhage, unspecified: Secondary | ICD-10-CM

## 2012-02-29 NOTE — Assessment & Plan Note (Signed)
Secondary to radiation proctitis-resolved

## 2012-02-29 NOTE — Patient Instructions (Addendum)
You have been given a separate informational sheet regarding your tobacco use, the importance of quitting and local resources to help you quit.  Follow up as needed 

## 2012-02-29 NOTE — Assessment & Plan Note (Signed)
Rectal bleeding has entirely subsided following treatment with the argon plasma coagulator.

## 2012-02-29 NOTE — Progress Notes (Signed)
History of Present Illness:  Terry Villa has returned following hospitalization in February, 2013 for rectal bleeding. 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 He has not had any further bleeding. He denies abdominal pain or diarrhea.    Review of Systems: Pertinent positive and negative review of systems were noted in the above HPI section. All other review of systems were otherwise negative.    Current Medications, Allergies, Past Medical History, Past Surgical History, Family History and Social History were reviewed in [employer] electronic medical record  Vital signs were reviewed in today's medical record. Physical Exam: General: Well developed , well nourished, no acute distress

## 2012-08-16 ENCOUNTER — Inpatient Hospital Stay (HOSPITAL_COMMUNITY)
Admission: EM | Admit: 2012-08-16 | Discharge: 2012-08-20 | DRG: 690 | Disposition: A | Discharge status: Home Health | Payer: Medicare Other | Attending: Internal Medicine | Admitting: Internal Medicine

## 2012-08-16 ENCOUNTER — Encounter (HOSPITAL_COMMUNITY): Payer: Self-pay | Admitting: Emergency Medicine

## 2012-08-16 DIAGNOSIS — N529 Male erectile dysfunction, unspecified: Secondary | ICD-10-CM

## 2012-08-16 DIAGNOSIS — N039 Chronic nephritic syndrome with unspecified morphologic changes: Secondary | ICD-10-CM | POA: Diagnosis present

## 2012-08-16 DIAGNOSIS — E871 Hypo-osmolality and hyponatremia: Secondary | ICD-10-CM

## 2012-08-16 DIAGNOSIS — W19XXXA Unspecified fall, initial encounter: Secondary | ICD-10-CM

## 2012-08-16 DIAGNOSIS — D631 Anemia in chronic kidney disease: Secondary | ICD-10-CM | POA: Diagnosis present

## 2012-08-16 DIAGNOSIS — R1319 Other dysphagia: Secondary | ICD-10-CM

## 2012-08-16 DIAGNOSIS — I69959 Hemiplegia and hemiparesis following unspecified cerebrovascular disease affecting unspecified side: Secondary | ICD-10-CM

## 2012-08-16 DIAGNOSIS — R609 Edema, unspecified: Secondary | ICD-10-CM | POA: Diagnosis present

## 2012-08-16 DIAGNOSIS — Z8679 Personal history of other diseases of the circulatory system: Secondary | ICD-10-CM

## 2012-08-16 DIAGNOSIS — I129 Hypertensive chronic kidney disease with stage 1 through stage 4 chronic kidney disease, or unspecified chronic kidney disease: Secondary | ICD-10-CM | POA: Diagnosis present

## 2012-08-16 DIAGNOSIS — R102 Pelvic and perineal pain: Secondary | ICD-10-CM | POA: Diagnosis present

## 2012-08-16 DIAGNOSIS — Z860100 Personal history of colon polyps, unspecified: Secondary | ICD-10-CM

## 2012-08-16 DIAGNOSIS — I2510 Atherosclerotic heart disease of native coronary artery without angina pectoris: Secondary | ICD-10-CM

## 2012-08-16 DIAGNOSIS — R600 Localized edema: Secondary | ICD-10-CM | POA: Diagnosis present

## 2012-08-16 DIAGNOSIS — K5900 Constipation, unspecified: Secondary | ICD-10-CM

## 2012-08-16 DIAGNOSIS — B961 Klebsiella pneumoniae [K. pneumoniae] as the cause of diseases classified elsewhere: Secondary | ICD-10-CM | POA: Diagnosis present

## 2012-08-16 DIAGNOSIS — Z8546 Personal history of malignant neoplasm of prostate: Secondary | ICD-10-CM

## 2012-08-16 DIAGNOSIS — E876 Hypokalemia: Secondary | ICD-10-CM

## 2012-08-16 DIAGNOSIS — R234 Changes in skin texture: Secondary | ICD-10-CM

## 2012-08-16 DIAGNOSIS — N182 Chronic kidney disease, stage 2 (mild): Secondary | ICD-10-CM

## 2012-08-16 DIAGNOSIS — D649 Anemia, unspecified: Secondary | ICD-10-CM

## 2012-08-16 DIAGNOSIS — Z85038 Personal history of other malignant neoplasm of large intestine: Secondary | ICD-10-CM

## 2012-08-16 DIAGNOSIS — G4700 Insomnia, unspecified: Secondary | ICD-10-CM | POA: Diagnosis present

## 2012-08-16 DIAGNOSIS — E785 Hyperlipidemia, unspecified: Secondary | ICD-10-CM

## 2012-08-16 DIAGNOSIS — C61 Malignant neoplasm of prostate: Secondary | ICD-10-CM

## 2012-08-16 DIAGNOSIS — Z87891 Personal history of nicotine dependence: Secondary | ICD-10-CM

## 2012-08-16 DIAGNOSIS — R339 Retention of urine, unspecified: Secondary | ICD-10-CM

## 2012-08-16 DIAGNOSIS — M7989 Other specified soft tissue disorders: Secondary | ICD-10-CM

## 2012-08-16 DIAGNOSIS — K219 Gastro-esophageal reflux disease without esophagitis: Secondary | ICD-10-CM

## 2012-08-16 DIAGNOSIS — I10 Essential (primary) hypertension: Secondary | ICD-10-CM

## 2012-08-16 DIAGNOSIS — N390 Urinary tract infection, site not specified: Principal | ICD-10-CM

## 2012-08-16 DIAGNOSIS — K627 Radiation proctitis: Secondary | ICD-10-CM

## 2012-08-16 HISTORY — DX: Chronic kidney disease, stage 2 (mild): N18.2

## 2012-08-16 LAB — CBC WITH DIFFERENTIAL/PLATELET
Basophils Absolute: 0 10*3/uL (ref 0.0–0.1)
Basophils Relative: 0 % (ref 0–1)
Eosinophils Relative: 1 % (ref 0–5)
HCT: 35.7 % — ABNORMAL LOW (ref 39.0–52.0)
Hemoglobin: 12.9 g/dL — ABNORMAL LOW (ref 13.0–17.0)
MCH: 33.8 pg (ref 26.0–34.0)
MCHC: 36.1 g/dL — ABNORMAL HIGH (ref 30.0–36.0)
MCV: 93.5 fL (ref 78.0–100.0)
Monocytes Absolute: 0.6 10*3/uL (ref 0.1–1.0)
Monocytes Relative: 8 % (ref 3–12)
RDW: 11.8 % (ref 11.5–15.5)

## 2012-08-16 LAB — COMPREHENSIVE METABOLIC PANEL
AST: 33 U/L (ref 0–37)
Albumin: 3.5 g/dL (ref 3.5–5.2)
BUN: 6 mg/dL (ref 6–23)
CO2: 22 mEq/L (ref 19–32)
Calcium: 8.9 mg/dL (ref 8.4–10.5)
Creatinine, Ser: 0.82 mg/dL (ref 0.50–1.35)
GFR calc non Af Amer: >90 mL/min (ref >90)
Total Bilirubin: 0.4 mg/dL (ref 0.3–1.2)

## 2012-08-16 LAB — URINALYSIS, ROUTINE W REFLEX MICROSCOPIC
Bilirubin Urine: NEGATIVE
Ketones, ur: NEGATIVE mg/dL
Nitrite: NEGATIVE
pH: 6 (ref 5.0–8.0)

## 2012-08-16 LAB — POCT I-STAT, CHEM 8
Calcium, Ion: 1.08 mmol/L — ABNORMAL LOW (ref 1.13–1.30)
Chloride: 96 mEq/L (ref 96–112)
HCT: 38 % — ABNORMAL LOW (ref 39.0–52.0)
Hemoglobin: 12.9 g/dL — ABNORMAL LOW (ref 13.0–17.0)
TCO2: 23 mmol/L (ref 0–100)

## 2012-08-16 LAB — URINE MICROSCOPIC-ADD ON

## 2012-08-16 MED ORDER — LACTULOSE ENCEPHALOPATHY 10 GM/15ML PO SOLN
10.0000 g | Freq: Every day | ORAL | Status: DC | PRN
  Filled 2012-08-16: qty 15

## 2012-08-16 MED ORDER — TOPIRAMATE 100 MG PO TABS
100.0000 mg | ORAL_TABLET | Freq: Every day | ORAL | Status: DC
  Administered 2012-08-16 – 2012-08-19 (×4): 100 mg via ORAL
  Filled 2012-08-16 (×5): qty 1

## 2012-08-16 MED ORDER — ENOXAPARIN SODIUM 40 MG/0.4ML ~~LOC~~ SOLN
40.0000 mg | SUBCUTANEOUS | Status: DC
  Administered 2012-08-16 – 2012-08-19 (×4): 40 mg via SUBCUTANEOUS
  Filled 2012-08-16 (×5): qty 0.4

## 2012-08-16 MED ORDER — PANTOPRAZOLE SODIUM 40 MG PO TBEC
80.0000 mg | DELAYED_RELEASE_TABLET | Freq: Every day | ORAL | Status: DC
  Administered 2012-08-16 – 2012-08-20 (×5): 80 mg via ORAL
  Filled 2012-08-16 (×5): qty 2

## 2012-08-16 MED ORDER — SODIUM CHLORIDE 0.9 % IV SOLN
2.0000 g | Freq: Once | INTRAVENOUS | Status: AC
  Administered 2012-08-16: 2 g via INTRAVENOUS
  Filled 2012-08-16: qty 20

## 2012-08-16 MED ORDER — SENNOSIDES-DOCUSATE SODIUM 8.6-50 MG PO TABS
1.0000 | ORAL_TABLET | Freq: Every evening | ORAL | Status: DC | PRN
  Filled 2012-08-16: qty 1

## 2012-08-16 MED ORDER — ASPIRIN 81 MG PO CHEW
81.0000 mg | CHEWABLE_TABLET | Freq: Every day | ORAL | Status: DC
  Administered 2012-08-16 – 2012-08-20 (×5): 81 mg via ORAL
  Filled 2012-08-16 (×5): qty 1

## 2012-08-16 MED ORDER — ONDANSETRON HCL 4 MG PO TABS: 4.0000 mg | ORAL_TABLET | Freq: Four times a day (QID) | ORAL | Status: DC | PRN

## 2012-08-16 MED ORDER — HYDROCODONE-ACETAMINOPHEN 5-325 MG PO TABS: 1.0000 | ORAL_TABLET | ORAL | Status: DC | PRN

## 2012-08-16 MED ORDER — HYDRALAZINE HCL 20 MG/ML IJ SOLN: 10.0000 mg | INTRAMUSCULAR | Status: DC | PRN

## 2012-08-16 MED ORDER — ACETAMINOPHEN 500 MG PO TABS
500.0000 mg | ORAL_TABLET | Freq: Four times a day (QID) | ORAL | Status: DC | PRN
  Administered 2012-08-17: 500 mg via ORAL
  Filled 2012-08-16: qty 1

## 2012-08-16 MED ORDER — METOPROLOL TARTRATE 25 MG PO TABS
25.0000 mg | ORAL_TABLET | Freq: Two times a day (BID) | ORAL | Status: DC
  Administered 2012-08-16 – 2012-08-20 (×6): 25 mg via ORAL
  Filled 2012-08-16 (×9): qty 1

## 2012-08-16 MED ORDER — DEXTROSE 5 % IV SOLN
1.0000 g | INTRAVENOUS | Status: DC
  Administered 2012-08-16 – 2012-08-18 (×3): 1 g via INTRAVENOUS
  Filled 2012-08-16 (×4): qty 10

## 2012-08-16 MED ORDER — TAMSULOSIN HCL 0.4 MG PO CAPS
0.8000 mg | ORAL_CAPSULE | Freq: Every day | ORAL | Status: DC
  Administered 2012-08-16 – 2012-08-19 (×4): 0.8 mg via ORAL
  Filled 2012-08-16 (×5): qty 2

## 2012-08-16 MED ORDER — SODIUM CHLORIDE 0.9 % IV SOLN
INTRAVENOUS | Status: DC
  Administered 2012-08-16 – 2012-08-19 (×3): via INTRAVENOUS

## 2012-08-16 MED ORDER — SODIUM CHLORIDE 0.9 % IV SOLN: INTRAVENOUS | Status: AC

## 2012-08-16 MED ORDER — ONDANSETRON HCL 4 MG/2ML IJ SOLN: 4.0000 mg | Freq: Four times a day (QID) | INTRAMUSCULAR | Status: DC | PRN

## 2012-08-16 NOTE — ED Notes (Signed)
Not much relief after foley inserted pain scale 7/10-8/10.

## 2012-08-16 NOTE — Progress Notes (Signed)
VASCULAR LAB PRELIMINARY  PRELIMINARY  PRELIMINARY  PRELIMINARY  Right lower extremity venous Doppler completed.    Preliminary report:  There is no DVT or SVT noted in the right lower extremity.  Briasia Flinders, RVT 08/16/2012, 5:16 PM

## 2012-08-16 NOTE — ED Notes (Signed)
OZH:[id number]<HQ> Expected date:<BR> Expected time:<BR> Means of arrival:<BR> Comments:<BR> 67yoF n/v, syncope yesterday

## 2012-08-16 NOTE — ED Notes (Addendum)
Reports lower abdominal pain and points to RLQ area.  Onset awaken throughout the night worse around 600 am 10/10. Stated the pain feels like he has to go to void.  Last time patient voided 2330-1200 am  Unable to void this am. Palpated bladder non-distended. Denies N/V. Last BM was yesterday reports normal, denies constipation, denies blood.

## 2012-08-16 NOTE — H&P (Signed)
[facility] History and Physical  Kemon Devincenzi UJW:[id number] DOB: [birth year] DOA: 08/16/2012  Referring physician: Dr. 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    The patient states he was in his usual state of health until approximately three days ago when he developed increased difficulty getting his stream started, decreased stream, and sensation of incomplete bladder emptying.  He also had some increased urgency, dysuria and decreased volume of urine.  He denies fevers, chills, nausea, vomiting, diarrhea, back pain.  Around 9pm the night prior to admission, he developed 10/10 throbbing suprapubic abdominal pain.  The pain comes and goes.  He voided once around 10pm last night and again around 4AM this morning, but was not able to void after that time.  This morning, the pain was unbearable and he called EMS.  Nothing brings on the pain and foley insertion and antibiotics have improved his pain marginally.  In the ER, labs were notable for sodium of 124, potassium 4.1, chloride 91, bicarb 22, BUN 6, creatinine 0.82, WBC 7.3, UA with + LE and too numerous to count WBC.  Pain was partly relieved by placement of foley catheter, however, only about 300 ml of urine drained during foley insertion.  He was given a dose of ceftriaxone and admitted to observation for hydration, antibiotics, pain control, and evaluation of functional status.    Review of Systems:   Patient denies fevers, chills, headache, changes in hearing and vision.  Has chronic difficulty hearing.  Denies rhinorrhea, sinus congestion, sore throat, chest pain, shortness of breath, nausea, vomiting, diarrhea,  constipation, hematuria, blood in stools, lymphadenopathy, abnl bruising or bleeding, new focal weakness or slurred speech.  He has chronic weakness of the right arm and right leg after previous CVA.    Past Medical History  Diagnosis Date  . 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 DYSLIPIDEMIA 08/26/2009  . HYPERTENSION 08/29/2009  . GERD 08/29/2009  . RENAL DISEASE, CHRONIC 08/26/2009  . 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 CARCINOMA, COLON, HX OF 06/14/2010    treated with injections?  detected through a blood test, not by colonoscopy  . Personal history of colonic polyps 07/19/2010  . TOBACCO USE, QUIT 08/29/2009  . Stroke     Residual right upper and right lower extremity weakness.  . Myocardial infarction 2008    No cath, stents, or surgery  . Shortness of breath   . Coronary artery disease    Past Surgical History  Procedure Date  . Liver biopsy   . Colon surgery    Social History:  reports that he quit smoking about 23 years ago. His smoking use included Cigarettes. He uses smokeless tobacco. He reports that he drinks alcohol. He reports that he does not use illicit drugs. Lives in an apartment by himself.  He uses a cane to ambulate.  He does not drive.  He walks or catches the bus.  He pays his own bills, and completes ADLs without assistance.  No Known Allergies  Family History  Problem Relation Age of Onset  . Breast cancer Mother   . Heart disease Father   . Clotting disorder Maternal Uncle    Prior to Admission medications   Medication  Sig Start Date End Date Taking? Authorizing Provider  acetaminophen (TYLENOL) 500 MG tablet Take 500 mg by mouth every 6 (six) hours as needed. As needed for pain   Yes Historical Provider, MD  aspirin 81 MG tablet Take 81 mg by mouth daily.   Yes Historical Provider, MD  lactulose, encephalopathy, (GENERLAC) 10 GM/15ML SOLN Take 10 g by mouth daily as needed. Constipation   Yes Historical Provider, MD  metoprolol tartrate  (LOPRESSOR) 25 MG tablet Take 25 mg by mouth 2 (two) times daily.   Yes Historical Provider, MD  Tamsulosin HCl (FLOMAX) 0.4 MG CAPS Take 0.4 mg by mouth at bedtime.   Yes Historical Provider, MD  topiramate (TOPAMAX) 100 MG tablet Take 100 mg by mouth at bedtime.   Yes Historical Provider, MD  omeprazole (PRILOSEC) 20 MG capsule Take 2 capsules (40 mg total) by mouth daily. 11/09/11   Ripudeep Jenna Luo, MD   Physical Exam: Filed Vitals:   08/16/12 1140 08/16/12 1311 08/16/12 1410 08/16/12 1543  BP: 121/61 122/55 128/65 161/77  Pulse: 109 52 56 64  Temp: 99 F (37.2 C) 98.6 F (37 C) 98.2 F (36.8 C) 97.9 F (36.6 C)  TempSrc: Oral Oral Oral Oral  Resp: 20   18  Height:    5\' 8"  (1.727 m)  Weight:    59.3 kg (130 lb 11.7 oz)  SpO2: 97%   100%     General:  Caucasian male, no acute distress, lying on stretcher  Eyes: PERRL, cholesterol halos, anicteric, noninjected  ENT: nares not congestion, oropharynx nonerythematous, no exudate, MMM (has cup of water in room)  Neck: supple, no thyromegaly  Lymph:  No cervical, supraclavicular, submandibular LAD  Cardiovascular: RRR, no mrg, 2+ pulses  Respiratory: CTAB  Abdomen: NABS, soft, nondistended, tender to palpation in the right lower quadrant and suprapubic area without rebound or guarding  Skin:  Tightness of the skin of the right hand with dilated capillaries and some nail pitting.  Loss of skin creases of affected fingers.  Mild skin tenting.    Musculoskeletal: 1+ edema, mild redness, and warmth of the right ankle without calf tenderness or palpable cords, no LLE edema.  Decreased ROM of right hand and right ankle  Psychiatric: A&Ox4, but made some statements during history which were contradictory to chart records, including denying that he was treated for prostate cancer.  Mild confusion  Neurologic: III-XII grossly intact, strength right upper extremity 4/5, grip 3/5, RLE 4/5.  LUE and LLE 5/5.    Labs on Admission:  Basic  Metabolic Panel:  Lab 08/16/12 1610 08/16/12 1126  NA 126* 124*  K 4.4 4.1  CL 96 91*  CO2 -- 22  GLUCOSE 86 86  BUN 4* 6  CREATININE 0.90 0.82  CALCIUM -- 8.9  MG -- --  PHOS -- --   Liver Function Tests:  Lab 08/16/12 1126  AST 33  ALT 16  ALKPHOS 75  BILITOT 0.4  PROT 6.5  ALBUMIN 3.5   No results found for this basename: LIPASE:5,AMYLASE:5 in the last 168 hours No results found for this basename: AMMONIA:5 in the last 168 hours CBC:  Lab 08/16/12 1139 08/16/12 1126  WBC -- 7.3  NEUTROABS -- 6.0  HGB 12.9* 12.9*  HCT 38.0* 35.7*  MCV -- 93.5  PLT -- 297   Cardiac Enzymes: No results found for this basename: CKTOTAL:5,CKMB:5,CKMBINDEX:5,TROPONINI:5 in the last 168 hours  BNP (last 3 results) No results found for this basename: PROBNP:3 in  the last 8760 hours CBG: No results found for this basename: GLUCAP:5 in the last 168 hours  Radiological Exams on Admission: No results found.  Assessment/Plan Principal Problem:  *UTI (lower urinary tract infection) Active Problems:  ADENOCARCINOMA, PROSTATE  DYSLIPIDEMIA  HYPERTENSION  GERD  RENAL DISEASE, CHRONIC  CEREBROVASCULAR ACCIDENT, HX OF  Urinary retention  Changes in skin texture  Suprapubic pain  Hyponatremia  Edema of right lower extremity  UTI with abdominal pain:  Likely contributing to urinary retention, dysuria -  F/u urine culture -  Continue ceftriaxone -  Tylenol and vicodin prn  Urinary retention:  LIkely due to UTI, but retention may have led to UTI -  Abx as above -  Increase flomax -  Continue foley this evening for comfort and D/C in AM.  Will need PVR  Hyponatremia:  Likely due to dehydration and urinary retention/obstruction -  Urine osms and sodium -  Hydrate with NS at 17ml/h x 15h -  Repeat electrolytes in AM  Right lower extremity edema:  Most likely due to CVA induced venous stasis but rule out DVT -  Right lower extremity duplex  Skin changes to right hand  suggestive of scleroderma, also has telangiectasias.   -  ANA -  Anti-centromere -  Anti-Scl 70  Hypocalemia:  Will give Calcium 2gm Normocytic anemia, likely ACD:  Hemoglobin at baseline of around 12-13 HTN:  Blood pressure elevated in setting of severe pain.  Control pain, continue metoprolol CAD:  Continue aspirin, beta blocker.  Patient is not on statin.  Discuss statin initiation in AM. GERD:  Stable. Continue PPI CKD stage 2:  Avoid nephrotoxins.  Creatinine lower than previous baseline Insomnia:  Stable. Continue topamax  Diet:  Healthy heart ACCESS:  PIV IVF: NS at 156ml/h x 15 h PROPH:  lovenox  Code Status: full code Family Communication: patient alone at bedside Disposition Plan: PT/OT ordered.  Possibly tomorrow if pain   Time spent: 71  Renae Fickle [facility] Pager [phone]  If 7PM-7AM, please contact night-coverage [email or web address] Password [email or web address] 08/16/2012, 4:06 PM

## 2012-08-16 NOTE — ED Notes (Signed)
MD at bedside. 

## 2012-08-16 NOTE — ED Provider Notes (Addendum)
History     CSN: [id number]  Arrival date & time 08/16/12  1042   First MD Initiated Contact with Patient 08/16/12 1106      Chief Complaint  Patient presents with  . Abdominal Pain  . Urinary Retention    (Consider location/radiation/quality/duration/timing/severity/associated sxs/prior treatment) Patient is a 66 y.o. male presenting with abdominal pain. The history is provided by the patient.  Abdominal Pain The primary symptoms of the illness include abdominal pain.   patient here complaining of urinary retention x12 hours. Notes suprapubic pain and pressure without vomiting or fever. Patient has a history of prostate No flank pain or fever. Has been urinating only small amounts. No treatment used prior to arrival and he arrived by EMS  Past Medical History  Diagnosis Date  . 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 DYSLIPIDEMIA 08/26/2009  . HYPERTENSION 08/29/2009  . GERD 08/29/2009  . RENAL DISEASE, CHRONIC 08/26/2009  . 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 CARCINOMA, COLON, HX OF 06/14/2010  . Personal history of colonic polyps 07/19/2010  . TOBACCO USE, QUIT 08/29/2009  . Stroke     Residual right upper and right lower extremity weakness.  . Myocardial infarction   . Shortness of breath   . Coronary artery disease     Past Surgical History  Procedure Date  . Liver biopsy   . Colon surgery     Family History  Problem Relation Age of Onset  . Breast cancer Mother   . Heart disease Father   . Clotting disorder Maternal Uncle     History  Substance Use Topics  . Smoking status: Former Smoker    Types: Cigarettes  . Smokeless tobacco: Current User     Comment: Widowed, lives alone-but has lady friend. enjoys spending time with his 7 g-kids  . Alcohol Use: Yes     Comment: occasional      Review of Systems  Gastrointestinal: Positive for abdominal pain.  All other systems reviewed and are negative.    Allergies  Review of patient's allergies  indicates no known allergies.  Home Medications   Current Outpatient Rx  Name  Route  Sig  Dispense  Refill  . ACETAMINOPHEN 500 MG PO TABS   Oral   Take 500 mg by mouth every 6 (six) hours as needed. As needed for pain         . ASPIRIN 81 MG PO TABS   Oral   Take 81 mg by mouth daily.         Marland Kitchen GENERLAC 10 GM/15ML PO SOLN      TAKE 1 TABLESPOONFUL TWICE DAILY FOR CONSTIPATION OR AMMONIA   1 mL   0     Rx denied   . METOPROLOL TARTRATE 25 MG PO TABS   Oral   Take 25 mg by mouth 2 (two) times daily.         Marland Kitchen OMEPRAZOLE 20 MG PO CPDR   Oral   Take 2 capsules (40 mg total) by mouth daily.   60 capsule   3   . SOLIFENACIN SUCCINATE 5 MG PO TABS   Oral   Take 10 mg by mouth daily.         Marland Kitchen TAMSULOSIN HCL 0.4 MG PO CAPS   Oral   Take 0.4 mg by mouth at bedtime.         . TOPIRAMATE 100 MG PO TABS   Oral   Take 100 mg by mouth at bedtime.  BP 176/95  Pulse 65  Temp 98.3 F (36.8 C) (Oral)  Resp 18  Ht 5\' 8"  (1.727 m)  Wt 152 lb (68.947 kg)  BMI 23.11 kg/m2  Physical Exam  Nursing note and vitals reviewed. Constitutional: He is oriented to person, place, and time. He appears well-developed and well-nourished.  Non-toxic appearance. No distress.  HENT:  Head: Normocephalic and atraumatic.  Eyes: Conjunctivae normal, EOM and lids are normal. Pupils are equal, round, and reactive to light.  Neck: Normal range of motion. Neck supple. No tracheal deviation present. No mass present.  Cardiovascular: Normal rate, regular rhythm and normal heart sounds.  Exam reveals no gallop.   No murmur heard. Pulmonary/Chest: Effort normal and breath sounds normal. No stridor. No respiratory distress. He has no decreased breath sounds. He has no wheezes. He has no rhonchi. He has no rales.  Abdominal: Soft. Normal appearance and bowel sounds are normal. He exhibits no distension. There is tenderness in the suprapubic area. There is no rigidity, no rebound,  no guarding and no CVA tenderness.  Musculoskeletal: Normal range of motion. He exhibits no edema and no tenderness.  Neurological: He is alert and oriented to person, place, and time. He has normal strength. No cranial nerve deficit or sensory deficit. GCS eye subscore is 4. GCS verbal subscore is 5. GCS motor subscore is 6.  Skin: Skin is warm and dry. No abrasion and no rash noted.  Psychiatric: He has a normal mood and affect. His speech is normal and behavior is normal.    ED Course  Procedures (including critical care time)  Labs Reviewed - No data to display No results found.   No diagnosis found.    MDM  Foley placed by nursing staff with get your return. Urinalysis shows infection and his sodium is decreased at 124. He still complains of suprapubic discomfort. He'll be admitted by the hospitalist service for observation        Toy Baker, MD 08/16/12 1359  Toy Baker, MD 08/16/12 [phone]

## 2012-08-17 DIAGNOSIS — G4700 Insomnia, unspecified: Secondary | ICD-10-CM | POA: Diagnosis present

## 2012-08-17 DIAGNOSIS — D649 Anemia, unspecified: Secondary | ICD-10-CM | POA: Diagnosis present

## 2012-08-17 DIAGNOSIS — I2510 Atherosclerotic heart disease of native coronary artery without angina pectoris: Secondary | ICD-10-CM | POA: Diagnosis present

## 2012-08-17 LAB — BASIC METABOLIC PANEL
BUN: 10 mg/dL (ref 6–23)
Chloride: 99 mEq/L (ref 96–112)
Creatinine, Ser: 1.03 mg/dL (ref 0.50–1.35)
GFR calc Af Amer: 85 mL/min — ABNORMAL LOW (ref >90)
GFR calc non Af Amer: 74 mL/min — ABNORMAL LOW (ref >90)
Glucose, Bld: 93 mg/dL (ref 70–99)

## 2012-08-17 LAB — OSMOLALITY, URINE: Osmolality, Ur: 163 mOsm/kg — ABNORMAL LOW (ref 390–1090)

## 2012-08-17 LAB — CBC
HCT: 32.5 % — ABNORMAL LOW (ref 39.0–52.0)
MCH: 33.6 pg (ref 26.0–34.0)
MCHC: 35.4 g/dL (ref 30.0–36.0)
RDW: 12 % (ref 11.5–15.5)

## 2012-08-17 NOTE — Progress Notes (Signed)
[facility] PROGRESS NOTE  Andra Matsuo VHQ:[id number] DOB: Jan 22, 1946 DOA: 08/16/2012 PCP: Rene Paci, MD Urologist: Lindaann Slough  Brief narrative: Terry Villa is a 66 year old man with a past medical history of prostate cancer, status post radiation therapy, complicated by proctitis and urinary obstruction as well as CVA with residual right hemiparesis who presented to the hospital on 08/16/2012 with abdominal pain and urinary retention. Patient's symptoms improved with placement of a Foley catheter and empiric antibiotics.  Assessment/Plan: Principal Problem:  *UTI (lower urinary tract infection) / urinary retention / Suprapubic pain  The patient bladder was decompressed by placement of Foley catheter. His Flomax dose was increased. He was put on empiric Rocephin for treatment of his urinary tract infection.  We'll need to have a voiding trial to determine if he is able to void and empty his bladder without a catheter.  He wishes to defer this until tomorrow. Active Problems:  ADENOCARCINOMA, PROSTATE  We'll need to rule out recurrent disease as the source of his urinary retention.  Check PSA.  DYSLIPIDEMIA  Continue heart healthy diet.  HYPERTENSION  Blood pressure controlled on metoprolol.  GERD  Continue Protonix.  RENAL DISEASE, CHRONIC, stage II  Creatinine stable. GFR 70-90.  CEREBROVASCULAR ACCIDENT, HX OF  Residual right hemiparesis. Continue risk factor modification.  Changes in skin texture  Worrisome for scleroderma. Followup ANA, anti-scleroderma antibodies.  Hyponatremia  Likely from dehydration. Followup urine osmolality and sodium. Being hydrated. Sodium trending up.  Normocytic anemia  Likely anemia of chronic disease. Baseline hemoglobin 12-13 mg/dL.  Edema of right lower extremity  Doppler studies negative for DVT or SVT.  Insomnia  Continue Topamax.  Coronary artery disease  Continue aspirin, beta blocker. Not currently on statin  therapy.  Hypocalcemia  Given 2 g of calcium.  Code Status: Full. Family Communication: None at bedside. Disposition Plan: Home when stable.   Medical Consultants:  None.  Other Consultants:  None  Anti-infectives:  Rocephin 08/16/12--->  HPI/Subjective: Terry Villa feels much better since the catheter was placed.  No F/C over night.  No abdominal pain.  Wants to wait until tomorrow to try a voiding trial.  Says he has not seen a urologist in 3 years. Treated with XRT.  Objective: Filed Vitals:   08/16/12 1543 08/16/12 2115 08/16/12 2230 08/17/12 0548  BP: 161/77 109/54 118/64 110/52  Pulse: 64 62  56  Temp: 97.9 F (36.6 C) 98.2 F (36.8 C)  98 F (36.7 C)  TempSrc: Oral Oral  Oral  Resp: 18 18  16   Height: 5\' 8"  (1.727 m)     Weight: 59.3 kg (130 lb 11.7 oz)     SpO2: 100% 99%  98%    Intake/Output Summary (Last 24 hours) at 08/17/12 0755 Last data filed at 08/17/12 0500  Gross per 24 hour  Intake     50 ml  Output   2227 ml  Net  -2177 ml    Exam: Gen:  NAD Cardiovascular:  RRR, No M/R/G Respiratory:  Lungs CTAB Gastrointestinal:  Abdomen soft, NT/ND, + BS Extremities:  No C/E/C  Data Reviewed: Basic Metabolic Panel:  Lab 08/17/12 4132 08/16/12 1139 08/16/12 1126  NA 132* 126* 124*  K 3.8 4.4 --  CL 99 96 91*  CO2 25 -- 22  GLUCOSE 93 86 86  BUN 10 4* 6  CREATININE 1.03 0.90 0.82  CALCIUM 8.5 -- 8.9  MG -- -- --  PHOS -- -- --   GFR Estimated Creatinine Clearance: 59.2  ml/min (by C-G formula based on Cr of 1.03). Liver Function Tests:  Lab 08/16/12 1126  AST 33  ALT 16  ALKPHOS 75  BILITOT 0.4  PROT 6.5  ALBUMIN 3.5    CBC:  Lab 08/17/12 0506 08/16/12 1139 08/16/12 1126  WBC 4.6 -- 7.3  NEUTROABS -- -- 6.0  HGB 11.5* 12.9* 12.9*  HCT 32.5* 38.0* 35.7*  MCV 95.0 -- 93.5  PLT 270 -- 297   Microbiology No results found for this or any previous visit (from the past 240 hour(s)).   Procedures and Diagnostic  Studies:  Doppler right lower extremity 08/16/2012: No DVT or SVT noted in the right lower extremity.  Scheduled Meds:   . [COMPLETED] sodium chloride   Intravenous STAT  . aspirin  81 mg Oral Daily  . [COMPLETED] calcium gluconate  2 g Intravenous Once  . cefTRIAXone (ROCEPHIN)  IV  1 g Intravenous Q24H  . enoxaparin (LOVENOX) injection  40 mg Subcutaneous Q24H  . metoprolol tartrate  25 mg Oral BID  . pantoprazole  80 mg Oral Daily  . Tamsulosin HCl  0.8 mg Oral QHS  . topiramate  100 mg Oral QHS   Continuous Infusions:   . sodium chloride 100 mL/hr at 08/17/12 0519    Time spent: 35 minutes   LOS: 1 day   Traeton Bordas  [facility] Pager [phone].  If 8PM-8AM, please contact night-coverage at [email or web address], password [email or web address] 08/17/2012, 7:55 AM

## 2012-08-18 LAB — URINE CULTURE: Colony Count: >=100000

## 2012-08-18 LAB — CENTROMERE ANTIBODIES: Centromere Ab Screen: <1 AU/mL (ref <30)

## 2012-08-18 LAB — FREE PSA: PSA, Free Pct: 11 % — ABNORMAL LOW (ref >25)

## 2012-08-18 NOTE — Evaluation (Signed)
Physical Therapy Evaluation Patient Details Name: Terry Villa MRN: [id number] DOB: 01/25/1946 Today's Date: 08/18/2012 Time: 0837-0900 PT Time Calculation (min): 23 min  PT Assessment / Plan / Recommendation Clinical Impression  66 y.o. male with h/o CVA and R HP admitted with UTI. Pt stated he walked with a cane at home independently PTA, but had multiple falls. He was unable to give clear h/o frequency of falls. Pt now requires +2 assist for bed to chair and walking due to weakness. He is at high risk for falls and lives alone, therefore SNF (possibly long term) is recommended.  Pt would benefit from acute PT to maximize safety and independence with mobility.      PT Assessment  Patient needs continued PT services    Follow Up Recommendations  SNF    Does the patient have the potential to tolerate intense rehabilitation      Barriers to Discharge        Equipment Recommendations  None recommended by PT    Recommendations for Other Services OT consult   Frequency Min 3X/week    Precautions / Restrictions Precautions Precautions: Fall Required Braces or Orthoses: Other Brace/Splint (RLE AFO) Restrictions Weight Bearing Restrictions: No   Pertinent Vitals/Pain **pt denied pain*      Mobility  Bed Mobility Bed Mobility: Supine to Sit Supine to Sit: 3: Mod assist Details for Bed Mobility Assistance: mod A to elevate trunk Transfers Transfers: Sit to Stand;Stand to Sit Sit to Stand: 1: +2 Total assist Sit to Stand: Patient Percentage: 50% Stand to Sit: 1: +2 Total assist Stand to Sit: Patient Percentage: 60% Details for Transfer Assistance: assist to achieve upright position 2* BLE weakness Ambulation/Gait Ambulation/Gait Assistance: 1: +2 Total assist Ambulation/Gait: Patient Percentage: 40% Ambulation Distance (Feet): 4 Feet Assistive device: Straight cane Gait Pattern: Left flexed knee in stance;Right flexed knee in stance;Decreased step length - right;Decreased  step length - left Gait velocity: decreased General Gait Details: assist to maintain balance    Shoulder Instructions     Exercises     PT Diagnosis: Difficulty walking;Generalized weakness;Hemiplegia dominant side;Abnormality of gait  PT Problem List: Decreased strength;Decreased activity tolerance;Decreased balance;Decreased safety awareness;Decreased cognition PT Treatment Interventions: Gait training;DME instruction;Functional mobility training;Therapeutic activities;Therapeutic exercise;Patient/family education   PT Goals Acute Rehab PT Goals PT Goal Formulation: With patient Time For Goal Achievement: 09/01/12 Potential to Achieve Goals: Good Pt will go Supine/Side to Sit: with min assist;with HOB 0 degrees PT Goal: Supine/Side to Sit - Progress: Goal set today Pt will go Sit to Stand: with min assist;with upper extremity assist PT Goal: Sit to Stand - Progress: Goal set today Pt will Ambulate: 16 - 50 feet;with min assist;with least restrictive assistive device PT Goal: Ambulate - Progress: Goal set today Pt will Perform Home Exercise Program: with min assist PT Goal: Perform Home Exercise Program - Progress: Goal set today  Visit Information  Last PT Received On: 08/18/12 Assistance Needed: +2 PT/OT Co-Evaluation/Treatment: Yes    Subjective Data  Subjective: I call my neighbor to help me when I fall or anytime I need help.  Patient Stated Goal: none stated   Prior Functioning  Home Living Lives With: Alone Type of Home: Apartment Home Access: Level entry Home Layout: One level Bathroom Shower/Tub: Tub/shower unit;Curtain Firefighter: Standard Bathroom Accessibility: Yes How Accessible: Other (comment) (cane) Home Adaptive Equipment: Straight cane;Shower chair with back;Other (comment) (R AFO) Additional Comments: Pt ambulated with straight cane and R AFO (Simultaneous filing. User may not have  seen previous data.) Prior Function Level of Independence:  Independent with assistive device(s) Able to Take Stairs?: No Driving: No (takes bus wherever he goes) Vocation: Retired Comments: has lady that lives near him that takes him places.    Cognition  Overall Cognitive Status: Impaired Area of Impairment: Safety/judgement;Memory Arousal/Alertness: Awake/alert Orientation Level: Place;Time Behavior During Session: WFL for tasks performed Safety/Judgement: Decreased awareness of need for assistance Safety/Judgement - Other Comments: pt reports h/o of multiple falls, but gave inconsistent answers when asked for details of frequency of falls Cognition - Other Comments: unclear what baseline is for pt, but pt did exhibit some decreased memory when providing hx    Extremity/Trunk Assessment Right Lower Extremity Assessment RLE ROM/Strength/Tone: Deficits RLE ROM/Strength/Tone Deficits: pt wears R AFO for walking, unable to fully to extend knee in standing Left Lower Extremity Assessment LLE ROM/Strength/Tone: Deficits LLE ROM/Strength/Tone Deficits: unable to fully extend L knee in standing LLE Sensation: WFL - Light Touch LLE Coordination: WFL - gross/fine motor Trunk Assessment Trunk Assessment: Normal   Balance Balance Balance Assessed: Yes Dynamic Sitting Balance Dynamic Sitting - Balance Support: No upper extremity supported;During functional activity (donning socks) Dynamic Sitting - Level of Assistance: 3: Mod assist  End of Session PT - End of Session Equipment Utilized During Treatment: Gait belt Activity Tolerance: Patient tolerated treatment well Patient left: in chair;with call bell/phone within reach Nurse Communication: Mobility status  GP Functional Assessment Tool Used: clinical judgement Functional Limitation: Mobility: Walking and moving around Mobility: Walking and Moving Around Current Status (B1478): At least 60 percent but less than 80 percent impaired, limited or restricted Mobility: Walking and Moving Around Goal  Status [phone]): At least 20 percent but less than 40 percent impaired, limited or restricted   Terry Villa 08/18/2012, 10:11 AM  [phone]

## 2012-08-18 NOTE — Evaluation (Signed)
Occupational Therapy Evaluation Patient Details Name: Terry Villa MRN: [id number] DOB: 1946/06/19 Today's Date: 08/18/2012 Time: 1478-2956 OT Time Calculation (min): 23 min  OT Assessment / Plan / Recommendation Clinical Impression  Pt admitted for abdominal pain, weakness and difficulty urinating and now has the deficits listed below.  pt would benefit from cont OT to increase I and safety with his basic adls since he does live alone.  Feel long term, pt does not need to be living alone and may need to explore other options.      OT Assessment  Patient needs continued OT Services    Follow Up Recommendations  SNF    Barriers to Discharge Decreased caregiver support Pt has neighbor that assists and an aid that comes 2x week for 2 hours but otherwise is home alone.  Pt reports several falls a week and is unsafe to return to this environment alone.  Equipment Recommendations  None recommended by PT    Recommendations for Other Services    Frequency  Min 2X/week    Precautions / Restrictions Precautions Precautions: Fall Precaution Comments: Pt seems to fall regularly at home. Required Braces or Orthoses: Other Brace/Splint (RLE AFO) Other Brace/Splint: AFO for RLE secondary to old CVA Restrictions Weight Bearing Restrictions: No   Pertinent Vitals/Pain Pt c/o abdominal pain.    ADL  Eating/Feeding: Simulated;Set up Where Assessed - Eating/Feeding: Chair Grooming: Simulated;Minimal assistance Where Assessed - Grooming: Supported sitting Upper Body Bathing: Simulated;Minimal assistance Where Assessed - Upper Body Bathing: Supported sitting Lower Body Bathing: Simulated;Moderate assistance Where Assessed - Lower Body Bathing: Supported sit to stand Upper Body Dressing: Simulated;Minimal assistance Where Assessed - Upper Body Dressing: Supported sitting Lower Body Dressing: Performed;Moderate assistance Where Assessed - Lower Body Dressing: Supported sit to stand Toilet  Transfer: Performed;+1 Total assistance;Other (comment) (Pt did 50% but very unsteady.) Toilet Transfer Method: Stand pivot Toilet Transfer Equipment: Bedside commode Toileting - Clothing Manipulation and Hygiene: Simulated;+1 Total assistance Where Assessed - Engineer, mining and Hygiene: Sit to stand from 3-in-1 or toilet Equipment Used: Gait belt;Cane Transfers/Ambulation Related to ADLs: Pt only transferred from bed to chair. Pt very unsteady with RLE buckling.   ADL Comments: Pt needs a great amount of assist with LE adls.  Feel this pt was probably "just making it" at home before this admission.      OT Diagnosis: Generalized weakness;Cognitive deficits;Acute pain  OT Problem List: Decreased strength;Decreased activity tolerance;Impaired balance (sitting and/or standing);Decreased coordination;Decreased cognition;Decreased safety awareness;Decreased knowledge of use of DME or AE;Decreased knowledge of precautions;Impaired UE functional use;Pain OT Treatment Interventions: Self-care/ADL training;Therapeutic activities   OT Goals Acute Rehab OT Goals OT Goal Formulation: With patient Time For Goal Achievement: 09/01/12 Potential to Achieve Goals: Good ADL Goals Pt Will Perform Grooming: with supervision;Standing at sink ADL Goal: Grooming - Progress: Goal set today Pt Will Perform Lower Body Bathing: with min assist;Sit to stand from chair;Sit to stand in shower ADL Goal: Lower Body Bathing - Progress: Goal set today Pt Will Perform Lower Body Dressing: with min assist;Sit to stand from chair ADL Goal: Lower Body Dressing - Progress: Goal set today Pt Will Transfer to Toilet: with min assist;3-in-1 ADL Goal: Toilet Transfer - Progress: Goal set today Pt Will Perform Toileting - Clothing Manipulation: with min assist;Standing ADL Goal: Toileting - Clothing Manipulation - Progress: Goal set today Pt Will Perform Toileting - Hygiene: with supervision;Sit to stand from  3-in-1/toilet ADL Goal: Toileting - Hygiene - Progress: Goal set today Pt Will Perform  Tub/Shower Transfer: Tub transfer;with min assist;Shower seat with back ADL Goal: Tub/Shower Transfer - Progress: Goal set today  Visit Information  Last OT Received On: 08/18/12 Assistance Needed: +2 PT/OT Co-Evaluation/Treatment: Yes    Subjective Data  Subjective: " Sometimes I fall 3x a week.  Sometimes I don't fall." Patient Stated Goal: to go back home.   Prior Functioning     Home Living Lives With: Alone Available Help at Discharge: Other (Comment);Home health;Friend(s) (has HHA 2x week.  2hours) Type of Home: Apartment Home Access: Level entry Home Layout: One level Bathroom Shower/Tub: Tub/shower unit;Curtain Firefighter: Standard Bathroom Accessibility: Yes How Accessible: Other (comment) (cane) Home Adaptive Equipment: Straight cane;Shower chair with back;Other (comment) (R AFO) Additional Comments: Pt ambulated with straight cane and R AFO (Simultaneous filing. User may not have seen previous data.) Prior Function Level of Independence: Independent with assistive device(s) Able to Take Stairs?: No Driving: No (takes bus wherever he goes) Vocation: Retired Comments: has lady that lives near him that takes him places. Communication Communication: HOH Dominant Hand: Left         Vision/Perception Vision - Assessment Vision Assessment: Vision not tested   Cognition  Overall Cognitive Status: Impaired Area of Impairment: Safety/judgement;Memory Arousal/Alertness: Awake/alert Orientation Level: Place;Time Behavior During Session: WFL for tasks performed Memory: Decreased recall of precautions Memory Deficits: Pt unable to recall the year.  Gave different answers on how often he falls at home. Safety/Judgement: Decreased awareness of need for assistance Safety/Judgement - Other Comments: pt reports h/o of multiple falls, but gave inconsistent answers when asked for  details of frequency of falls Awareness of Deficits: pt with decreased awareness of how severe his weakness and fall risk really is. Cognition - Other Comments: unclear what baseline is for pt, but pt did exhibit some decreased memory when providing hx    Extremity/Trunk Assessment Right Upper Extremity Assessment RUE ROM/Strength/Tone: Deficits RUE ROM/Strength/Tone Deficits: 3/5 throughout from old CVA RUE Sensation: WFL - Light Touch RUE Coordination: Deficits RUE Coordination Deficits: Pt with old CVA.  R fine motor coordination limited from old CVA although pt does use the RUE as much as possible and compensates well. Left Upper Extremity Assessment LUE ROM/Strength/Tone: Within functional levels LUE Sensation: WFL - Light Touch LUE Coordination: WFL - gross/fine motor Right Lower Extremity Assessment RLE ROM/Strength/Tone: Deficits RLE ROM/Strength/Tone Deficits: pt wears R AFO for walking, unable to fully to extend knee in standing Left Lower Extremity Assessment LLE ROM/Strength/Tone: Deficits LLE ROM/Strength/Tone Deficits: unable to fully extend L knee in standing LLE Sensation: WFL - Light Touch LLE Coordination: WFL - gross/fine motor Trunk Assessment Trunk Assessment: Normal     Mobility Bed Mobility Bed Mobility: Supine to Sit Supine to Sit: 3: Mod assist Details for Bed Mobility Assistance: mod A to elevate trunk Transfers Transfers: Sit to Stand;Stand to Sit Sit to Stand: 1: +2 Total assist Sit to Stand: Patient Percentage: 50% Stand to Sit: 1: +2 Total assist Stand to Sit: Patient Percentage: 60% Details for Transfer Assistance: assist to achieve upright position 2* BLE weakness     Shoulder Instructions     Exercise     Balance Balance Balance Assessed: Yes Dynamic Sitting Balance Dynamic Sitting - Balance Support: No upper extremity supported;During functional activity (donning socks) Dynamic Sitting - Level of Assistance: 3: Mod assist   End of  Session OT - End of Session Equipment Utilized During Treatment: Gait belt;Right ankle foot orthosis Activity Tolerance: Patient limited by fatigue Patient left: in chair;with  call bell/phone within reach Nurse Communication: Mobility status  GO Functional Assessment Tool Used: evaluation Functional Limitation: Self care Self Care Current Status (Z6109): At least 60 percent but less than 80 percent impaired, limited or restricted Self Care Goal Status (U0454): At least 20 percent but less than 40 percent impaired, limited or restricted   Hope Budds 08/18/2012, 10:11 AM [phone]

## 2012-08-18 NOTE — Progress Notes (Signed)
[facility] PROGRESS NOTE  Terry Villa YNW:[id number] DOB: 17-Mar-1946 DOA: 08/16/2012 PCP: Rene Paci, MD Urologist: Lindaann Slough  Brief narrative: Terry Villa is a 66 year old man with a past medical history of prostate cancer, status post radiation therapy, complicated by proctitis and urinary obstruction as well as CVA with residual right hemiparesis who presented to the hospital on 08/16/2012 with abdominal pain and urinary retention. Patient's symptoms improved with placement of a Foley catheter and empiric antibiotics.  Assessment/Plan: Principal Problem:  *Klebsiella Oxytoca UTI (lower urinary tract infection) / urinary retention / Suprapubic pain  The patient bladder was decompressed by placement of Foley catheter. His Flomax dose was increased. He was put on empiric Rocephin for treatment of his urinary tract infection. Final urine cultures grew Klebsiella. Rocephin sensitive.  Discontinue catheter and check post void residuals. Active Problems:  ADENOCARCINOMA, PROSTATE  We'll need to rule out recurrent disease as the source of his urinary retention.  PSA 0.76.  DYSLIPIDEMIA  Continue heart healthy diet.  HYPERTENSION  Blood pressure controlled on metoprolol.  GERD  Continue Protonix.  RENAL DISEASE, CHRONIC, stage II  Creatinine stable. GFR 70-90.  CEREBROVASCULAR ACCIDENT, HX OF  Residual right hemiparesis. Continue risk factor modification.  Changes in skin texture  Worrisome for scleroderma. Followup ANA, anti-scleroderma antibodies.  Hyponatremia  Likely from dehydration. Urine osmolality low at 163 and sodium 53. 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  Being hydrated. Sodium trending up.  Normocytic anemia  Likely anemia of chronic disease. Baseline hemoglobin 12-13 mg/dL.  Edema of right lower extremity  Doppler studies negative for DVT or SVT.  Insomnia  Continue Topamax.  Coronary artery disease  Continue aspirin, beta blocker. Not  currently on statin therapy.  Hypocalcemia  Given 2 g of calcium.  Code Status: Full. Family Communication: None at bedside. Disposition Plan: Home when stable.   Medical Consultants:  None.  Other Consultants:  None  Anti-infectives:  Rocephin 08/16/12--->  HPI/Subjective: Terry Villa is without complaints today. He was happy his PSA is suppressed. He understands that the Foley needs to come out for a voiding trial, but is nervous about it. He has many questions about what will happen if he is unable to void on his own, as he states he lives alone and does not have help.  Objective: Filed Vitals:   08/17/12 1003 08/17/12 1500 08/17/12 2058 08/18/12 0651  BP: 126/53 113/60 113/55 122/62  Pulse: 53 53 84 88  Temp:  98 F (36.7 C) 97.5 F (36.4 C) 98.1 F (36.7 C)  TempSrc:  Oral Oral Oral  Resp:  16 18 18   Height:      Weight:      SpO2:  99% 98% 98%    Intake/Output Summary (Last 24 hours) at 08/18/12 0809 Last data filed at 08/18/12 6578  Gross per 24 hour  Intake 3923.33 ml  Output   3800 ml  Net 123.33 ml    Exam: Gen:  NAD Cardiovascular:  RRR, No M/R/G Respiratory:  Lungs CTAB Gastrointestinal:  Abdomen soft, NT/ND, + BS Extremities:  No C/E/C  Data Reviewed: Basic Metabolic Panel:  Lab 08/17/12 4696 08/16/12 1139 08/16/12 1126  NA 132* 126* 124*  K 3.8 4.4 --  CL 99 96 91*  CO2 25 -- 22  GLUCOSE 93 86 86  BUN 10 4* 6  CREATININE 1.03 0.90 0.82  CALCIUM 8.5 -- 8.9  MG -- -- --  PHOS -- -- --   GFR Estimated Creatinine Clearance: 59.2 ml/min (by C-G  formula based on Cr of 1.03). Liver Function Tests:  Lab 08/16/12 1126  AST 33  ALT 16  ALKPHOS 75  BILITOT 0.4  PROT 6.5  ALBUMIN 3.5    CBC:  Lab 08/17/12 0506 08/16/12 1139 08/16/12 1126  WBC 4.6 -- 7.3  NEUTROABS -- -- 6.0  HGB 11.5* 12.9* 12.9*  HCT 32.5* 38.0* 35.7*  MCV 95.0 -- 93.5  PLT 270 -- 297   Microbiology Recent Results (from the past 240 hour(s))  URINE  CULTURE     Status: Normal   Collection Time   08/16/12 11:09 AM      Component Value Range Status Comment   Specimen Description URINE, CATHETERIZED   Final    Special Requests NONE   Final    Culture  Setup Time 08/16/2012 19:31   Final    Colony Count >=100,000 COLONIES/ML   Final    Culture KLEBSIELLA OXYTOCA   Final    Report Status 08/18/2012 FINAL   Final    Organism ID, Bacteria KLEBSIELLA OXYTOCA   Final      Procedures and Diagnostic Studies:  Doppler right lower extremity 08/16/2012: No DVT or SVT noted in the right lower extremity.  Scheduled Meds:    . aspirin  81 mg Oral Daily  . cefTRIAXone (ROCEPHIN)  IV  1 g Intravenous Q24H  . enoxaparin (LOVENOX) injection  40 mg Subcutaneous Q24H  . metoprolol tartrate  25 mg Oral BID  . pantoprazole  80 mg Oral Daily  . Tamsulosin HCl  0.8 mg Oral QHS  . topiramate  100 mg Oral QHS   Continuous Infusions:    . sodium chloride 100 mL/hr at 08/17/12 0519    Time spent: 25 minutes   LOS: 2 days   RAMA,CHRISTINA  [facility] Pager [phone].  If 8PM-8AM, please contact night-coverage at [email or web address], password [facility] 08/18/2012, 8:09 AM

## 2012-08-18 NOTE — Progress Notes (Signed)
Foley dc'd at 1130. Incontinent a large amount, then voided in urinal 340 cc. Pt states he can not always tell when he has to urinate. Dr. Darnelle Catalan called about his output and his residual of 164. She stated if he can stand or sit at the side of the bed and urinate another 100 cc, then another foley does not need to be put in. If he can't then another foley needs to be inserted. He voided another 100 cc.

## 2012-08-18 NOTE — Progress Notes (Signed)
After evaluating this pt, feel he could use a social work consult.  Pt will need SNF placement before returning home.  With that being said, feel this pt may need to  Consider some form of permanent placement.  Pt's clothing smells of urine and pt states he regularly falls at home and has to call people to  Come and get him off of the floor.  Pt with some mild cognitive deficits noted (not sure if this is baseline) but feel he is not safe to be home alone and this  May have been the case for some time now.  Will follow and rec SNF. Tory Emerald, [place] [phone]

## 2012-08-19 MED ORDER — CIPROFLOXACIN HCL 250 MG PO TABS
250.0000 mg | ORAL_TABLET | Freq: Two times a day (BID) | ORAL | Status: DC
  Administered 2012-08-19 – 2012-08-20 (×3): 250 mg via ORAL
  Filled 2012-08-19 (×5): qty 1

## 2012-08-19 NOTE — Progress Notes (Addendum)
[facility] PROGRESS NOTE  Ethan Clayburn MWU:[id number] DOB: [birth year] DOA: 08/16/2012 PCP: Rene Paci, MD Urologist: Lindaann Slough  Brief narrative: Mr. Langston is a 66 year old man with a past medical history of prostate cancer, status post radiation therapy, complicated by proctitis and urinary obstruction as well as CVA with residual right hemiparesis who presented to the hospital on 08/16/2012 with abdominal pain and urinary retention. Patient's symptoms improved with placement of a Foley catheter and empiric antibiotics. Physical and occupational therapy evaluations have been performed and they feel he has mild cognitive deficits and it is not safe for him to be alone. He is refusing skilled nursing home placement and therefore we have asked psychiatry to perform a competency evaluation.  Assessment/Plan: Principal Problem:  *Klebsiella Oxytoca UTI (lower urinary tract infection) / urinary retention / Suprapubic pain  The patient bladder was decompressed by placement of Foley catheter. His Flomax dose was increased. He was put on empiric Rocephin for treatment of his urinary tract infection. Final urine cultures grew Klebsiella. Rocephin and Cipro sensitive. Switched to oral Cipro therapy on 08/19/2012.  Failed voiding trial. Spoke with Dr. Brunilda Payor who recommends that the patient be sent home with a foley, and have outpatient follow up. Active Problems:  ADENOCARCINOMA, PROSTATE  No evidence of recurrent disease.  PSA 0.76.  DYSLIPIDEMIA  Continue heart healthy diet.  HYPERTENSION  Blood pressure controlled on metoprolol.  GERD  Continue Protonix.  RENAL DISEASE, CHRONIC, stage II  Creatinine stable. GFR 70-90.  CEREBROVASCULAR ACCIDENT, HX OF  Residual right hemiparesis. Continue risk factor modification.  Changes in skin texture  Scleroderma ruled out with serological testing.  Hyponatremia  Likely from dehydration. Urine osmolality low at 163 and sodium 53.  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  Being hydrated. Sodium trending up.  Normocytic anemia  Likely anemia of chronic disease. Baseline hemoglobin 12-13 mg/dL.  Edema of right lower extremity  Doppler studies negative for DVT or SVT.  Insomnia  Continue Topamax.  Coronary artery disease  Continue aspirin, beta blocker. Not currently on statin therapy.  Hypocalcemia  Given 2 g of calcium.  Code Status: Full. Family Communication: None at bedside.  Spoke with his daughter by telephone, Sonnie Alamo, after initially being told telephone number was not correct. The patient has another daughter and a son Elijah Birk). He is apparently estranged from the other daughter and Lavonna Rua is going to attempt to contact her brother so that they can try to convince the patient to go to a nursing home.  Elijah Birk can be reached at [phone].  Message left.   Disposition Plan: Home versus skilled nursing home depending on competency evaluation.   Medical Consultants:  Psychiatry  Telephone consult with Dr. Su Grand, Urology  Other Consultants:  Occupational therapy: Mild cognitive deficits. Unsafe at home.  Physical therapy: Skilled nursing home placement recommended.  Anti-infectives:  Rocephin 08/16/12--->  HPI/Subjective: Mr. Brar tells me he does not want to go to rehabilitation. He is without complaints today and says that he is voiding on his own. Nursing staff reports that he is sometimes incontinent. He identifies his son, who lives in [place], as being the person he's closest to, of his children.  Objective: Filed Vitals:   08/18/12 1044 08/18/12 1400 08/18/12 2041 08/19/12 0615  BP: 122/62 120/61 123/56 136/69  Pulse: 88 54 51 56  Temp:  97.5 F (36.4 C) 98.1 F (36.7 C) 97.6 F (36.4 C)  TempSrc:  Oral Oral Oral  Resp:  16 16 16   Height:  Weight:      SpO2:  100% 98% 98%    Intake/Output Summary (Last 24 hours) at 08/19/12 1100 Last data filed at 08/19/12 1004  Gross per 24  hour  Intake   3700 ml  Output   1690 ml  Net   2010 ml    Exam: Gen:  NAD Cardiovascular:  RRR, No M/R/G Respiratory:  Lungs CTAB Gastrointestinal:  Abdomen soft, NT/ND, + BS Extremities:  No C/E/C  Data Reviewed: Basic Metabolic Panel:  Lab 08/17/12 1610 08/16/12 1139 08/16/12 1126  NA 132* 126* 124*  K 3.8 4.4 --  CL 99 96 91*  CO2 25 -- 22  GLUCOSE 93 86 86  BUN 10 4* 6  CREATININE 1.03 0.90 0.82  CALCIUM 8.5 -- 8.9  MG -- -- --  PHOS -- -- --   GFR Estimated Creatinine Clearance: 59.2 ml/min (by C-G formula based on Cr of 1.03). Liver Function Tests:  Lab 08/16/12 1126  AST 33  ALT 16  ALKPHOS 75  BILITOT 0.4  PROT 6.5  ALBUMIN 3.5    CBC:  Lab 08/17/12 0506 08/16/12 1139 08/16/12 1126  WBC 4.6 -- 7.3  NEUTROABS -- -- 6.0  HGB 11.5* 12.9* 12.9*  HCT 32.5* 38.0* 35.7*  MCV 95.0 -- 93.5  PLT 270 -- 297   Microbiology Recent Results (from the past 240 hour(s))  URINE CULTURE     Status: Normal   Collection Time   08/16/12 11:09 AM      Component Value Range Status Comment   Specimen Description URINE, CATHETERIZED   Final    Special Requests NONE   Final    Culture  Setup Time 08/16/2012 19:31   Final    Colony Count >=100,000 COLONIES/ML   Final    Culture KLEBSIELLA OXYTOCA   Final    Report Status 08/18/2012 FINAL   Final    Organism ID, Bacteria KLEBSIELLA OXYTOCA   Final      Procedures and Diagnostic Studies:  Doppler right lower extremity 08/16/2012: No DVT or SVT noted in the right lower extremity.  Scheduled Meds:    . aspirin  81 mg Oral Daily  . cefTRIAXone (ROCEPHIN)  IV  1 g Intravenous Q24H  . enoxaparin (LOVENOX) injection  40 mg Subcutaneous Q24H  . metoprolol tartrate  25 mg Oral BID  . pantoprazole  80 mg Oral Daily  . Tamsulosin HCl  0.8 mg Oral QHS  . topiramate  100 mg Oral QHS   Continuous Infusions:    . sodium chloride 20 mL (08/19/12 1029)    Time spent: 25 minutes   LOS: 3 days    RAMA,CHRISTINA  [facility] Pager [phone].  If 8PM-8AM, please contact night-coverage at [email or web address], password [facility] 08/19/2012, 11:00 AM

## 2012-08-19 NOTE — Progress Notes (Addendum)
Clinical Social Work Department BRIEF PSYCHOSOCIAL ASSESSMENT 08/19/2012  Patient:  Terry Villa, Terry Villa     Account Number:  [id number]     Admit date:  08/16/2012  Clinical Social Worker:  Jacelyn Grip  Date/Time:  08/19/2012 10:45 AM  Referred by:  Physician  Date Referred:  08/19/2012 Referred for  SNF Placement   Other Referral:   Interview type:  Patient Other interview type:    PSYCHOSOCIAL DATA Living Status:  ALONE Admitted from facility:   Level of care:   Primary support name:  [facility] [place]/daughter Primary support relationship to patient:  CHILD, ADULT Degree of support available:   unknown at this time    CURRENT CONCERNS Current Concerns  Post-Acute Placement   Other Concerns:    SOCIAL WORK ASSESSMENT / PLAN CSW received referral from MD that pt needing SNF placement.    CSW met with pt at bedside to discuss. CSW discussed MD, PT, OT recommendation for SNF placement for rehab. Pt stated that he does not want to go to SNF and wants to return home. CSW inquired about if pt has 24 hour care, but pt reports that he does not have 24 hour care and feels that he would be safe with a HH nurse checking in. CSW discussed the safety concerns about pt being home alone as pt is currently needing two person assist with ambulations. Pt states that he was previously in a SNF and states that he does not want to go to SNF now because he has no one to look after his home. CSW inquired about pt children and pt reports that they live in area, but are "busy with their own lives", therefore CSW unsure of amount of support from pt children. CSW discussed with pt that he did not have to return to [facility] if that is his hesitance about SNF and pt was agreeable to reviewing a list of facilities, but not agreeable to initiation of SNF search. CSW will follow up with pt to discuss further. CSW discussed with MD and MD ordering psych evaluation for competency. CSW to continue to follow.    Assessment/plan status:  Psychosocial Support/Ongoing Assessment of Needs Other assessment/ plan:   discharge planning   Information/referral to community resources:   [facility] list    PATIENT'S/FAMILY'S RESPONSE TO PLAN OF CARE: Pt alert and oriented, but displays mild cognitive deficits. Pt is not safe returning home at this time and CSW discussed these concerns with the patientand pt only agreeable to reviewing list at this time, but not agreeable to SNF search.  Per MD, psych consult ordered for competency      Jacklynn Lewis, MSW, LCSWA  Clinical Social Work [phone]

## 2012-08-19 NOTE — Consult Note (Signed)
Patient Identification:  Terry Villa Date of Evaluation:  08/19/2012 Reason for Consult: Capacity  Referring Provider: Dr. Darnelle Villa  History of Present Illness:Pt presents to ED with c/o severe abdominal pain.  He was unable to void.   A catheter relieved urine retention.  He has been given antibiotics.   Past Psychiatric History:  He sees PCP only, declines info   Past Medical History:No history given    Past Medical History  Diagnosis Date  . ADENOCARCINOMA, PROSTATE 06/14/2010    Patient denies receiving tx for Pr CA, but had XRT with proctitis  . DYSLIPIDEMIA 08/26/2009  . HYPERTENSION 08/29/2009  . GERD 08/29/2009  . Chronic kidney disease, stage 2, mildly decreased GFR 08/26/2009  . 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 CARCINOMA, COLON, HX OF 06/14/2010    ??  Patient states he was dx with a blood test, not colonoscopy and he was treated with injections and cured.  States he never received chemo, XRT, or surgery.   . Personal history of colonic polyps 07/19/2010  . TOBACCO USE, QUIT 08/29/2009  . Stroke     Residual right upper and right lower extremity weakness.  . Myocardial infarction 2008    No cath, stents, or surgery per patient history, however, unreliable historian  . Shortness of breath   . Coronary artery disease        Past Surgical History  Procedure Date  . Liver biopsy     no record of liver biopsy and patient denies  . Colon surgery     patient denies colon surgery  . Colonoscopy, esophagogastroduodenoscopy (egd) and esophageal dilation 10/2011    Allergies: No Known Allergies  Current Medications:  Prior to Admission medications   Medication Sig Start Date End Date Taking? Authorizing Provider  acetaminophen (TYLENOL) 500 MG tablet Take 500 mg by mouth every 6 (six) hours as needed. As needed for pain   Yes Historical Provider, MD  aspirin 81 MG tablet Take 81 mg by mouth daily.   Yes Historical Provider, MD  lactulose, encephalopathy, (GENERLAC)  10 GM/15ML SOLN Take 10 g by mouth daily as needed. Constipation   Yes Historical Provider, MD  metoprolol tartrate (LOPRESSOR) 25 MG tablet Take 25 mg by mouth 2 (two) times daily.   Yes Historical Provider, MD  Tamsulosin HCl (FLOMAX) 0.4 MG CAPS Take 0.4 mg by mouth at bedtime.   Yes Historical Provider, MD  topiramate (TOPAMAX) 100 MG tablet Take 100 mg by mouth at bedtime.   Yes Historical Provider, MD  omeprazole (PRILOSEC) 20 MG capsule Take 2 capsules (40 mg total) by mouth daily. 11/09/11   Ripudeep Jenna Luo, MD    Social History:    reports that he quit smoking about 23 years ago. His smoking use included Cigarettes. He uses smokeless tobacco. He reports that he drinks alcohol. He reports that he does not use illicit drugs.   Family History:    Family History  Problem Relation Age of Onset  . Breast cancer Mother   . Heart disease Father   . Clotting disorder Maternal Uncle     Mental Status Examination/Evaluation: Objective:  Appearance: Casual and sleepy then responds; Foley cath. noted  Eye Contact::  Good  Speech:  Clear and Coherent  Volume:  Normal  Mood:  euthymic  Affect:  Appropriate  Thought Process:  Coherent  Orientation:  Other:  oriented to person place situation  Thought Content:  wanting to go home  Suicidal Thoughts:  No  Homicidal Thoughts:  No  Judgement:  Fair  Insight:  Fair   DIAGNOSIS:   AXIS I  Mild cognitive impairment,  AXIS II  Deferred  AXIS III See medical notes.  AXIS IV economic problems, other psychosocial or environmental problems, problems related to social environment and reluctant to expect help from family cliaming 'too busy', reluctant to impose  AXIS V 51-60 moderate symptoms   Assessment/Plan:  Dr. Darnelle Villa, Discussed with pt 1210-11/13 Pt is awake, alert; finished lunch.  He says he is ready to leave.  Plans made to stay with son and home health provided for PT needs.  He says he does not drive but walks to buy groceries.  He says  he pays all his bills and does not forget.  He cooks meat and beans and eats out.  He drinks tea, soda, beer, not much.  He stopped drinking a lot of beer long ago.  He smoked mor than 10 years.  He takes 12 prescriptions and says he does not forget.   He says he keeps his doctor appointments.  He keeps all appointments in a book he checks daily.  His PCP is in [place].  If he needs help, he knows he can call his Parthenia Ames: [phone]/1? He says today is 12-10-Tuesday 2013  He remembers 3/3 words for shor term memory.  He spells world but cannot concentrate to spell it backwards.  He cannot calculate serial 3s.  He interprets proverbs literally.  He expresses good judgment with emergency example.    He knows his problem is his bladder.  He says he feel safe at his home.   This patient demonstrates capcity with some sense of organization with some memory and concentration deficits.  He would benefit with some supervision and home care since he declines SNF.  RECOMMENDATION:  1.  Agree with final plan, since pt declines SNF, to stay with son and receive home health care.  2.  No change in medications suggested.  3.  No further psychiatric needs; discussed with Psych CSW 4.  MD Psychiatrist signs off Terry Rideout MD 08/19/2012 3:39 PM

## 2012-08-20 DIAGNOSIS — C61 Malignant neoplasm of prostate: Secondary | ICD-10-CM

## 2012-08-20 DIAGNOSIS — Z8679 Personal history of other diseases of the circulatory system: Secondary | ICD-10-CM

## 2012-08-20 DIAGNOSIS — Z85038 Personal history of other malignant neoplasm of large intestine: Secondary | ICD-10-CM

## 2012-08-20 MED ORDER — HYDROCODONE-ACETAMINOPHEN 5-325 MG PO TABS: 1.0000 | ORAL_TABLET | ORAL | Status: DC | PRN

## 2012-08-20 MED ORDER — ONDANSETRON HCL 4 MG PO TABS: 4.0000 mg | ORAL_TABLET | Freq: Four times a day (QID) | ORAL | Status: DC | PRN

## 2012-08-20 MED ORDER — CIPROFLOXACIN HCL 250 MG PO TABS: 250.0000 mg | ORAL_TABLET | Freq: Two times a day (BID) | ORAL | Status: DC

## 2012-08-20 NOTE — Progress Notes (Signed)
Instructed both patient and son on discharge instructions, and information on follow up MD appointments.  Patient initially insisted that foley catheter be removed.  After patient's son spoke with patient regarding catheter and it's neccesity, patient agreeable to discharge with foley in.  Son stated that he would be home, and was ready for patient's arrival.  Transport called by social work.  Instructed son to call department with any questions.  Philomena Doheny RN

## 2012-08-20 NOTE — Discharge Summary (Signed)
Physician Discharge Summary  Terry Villa JWJ:[id number] DOB: 1946-08-26 DOA: 08/16/2012  PCP: Rene Paci, MD  Admit date: 08/16/2012 Discharge date: 08/20/2012  Recommendations for Outpatient Follow-up:  1. Pt will need to follow up with PCP in 2-3 weeks post discharge 2. Please obtain BMP to evaluate electrolytes and kidney function 3. Please also check CBC to evaluate Hg and Hct levels 4. Please note that pt was strongly advised to be discharged to SNF but has refused and wanted to go home 5. This was discussed with his son who is willing to take care of the father at home 6. Pt will be discharged home with foley as per urology recommendations 7. Please note that pt was discharged on Ciprofloxacin to complete the therapy for Klebsiella UTI  Discharge Diagnoses: Klebsiella UTI Principal Problem:  *UTI (lower urinary tract infection) Active Problems:  ADENOCARCINOMA, PROSTATE  DYSLIPIDEMIA  HYPERTENSION  GERD  CKD (chronic kidney disease), stage II  CEREBROVASCULAR ACCIDENT, HX OF  Urinary retention  Changes in skin texture  Suprapubic pain  Hyponatremia  Edema of right lower extremity  Insomnia  Normocytic anemia  Coronary artery disease  Hypocalcemia  Discharge Condition: Stable  Diet recommendation: Heart healthy diet discussed in details   Brief narrative:  Terry Villa is a 66 year old man with a past medical history of prostate cancer, status post radiation therapy, complicated by proctitis and urinary obstruction as well as CVA with residual right hemiparesis who presented to the hospital on 08/16/2012 with abdominal pain and urinary retention. Patient's symptoms improved with placement of a Foley catheter and empiric antibiotics. Physical and occupational therapy evaluations have been performed and they feel he has mild cognitive deficits and it is not safe for him to be alone. He is refusing skilled nursing home placement and therefore we have asked psychiatry to  perform a competency evaluation.   Assessment/Plan:  Principal Problem:  *Klebsiella Oxytoca UTI (lower urinary tract infection) / urinary retention / Suprapubic pain  The patient bladder was decompressed by placement of Foley catheter. His Flomax dose was increased. He was put on empiric Rocephin for treatment of his urinary tract infection. Final urine cultures grew Klebsiella. Rocephin and Cipro sensitive. Switched to oral Cipro therapy on 08/19/2012.  Failed voiding trial. Spoke with Dr. Brunilda Payor who recommends that the patient be sent home with a foley, and have outpatient follow up. Active Problems:  ADENOCARCINOMA, PROSTATE  No evidence of recurrent disease. PSA 0.76. DYSLIPIDEMIA  Continue heart healthy diet. HYPERTENSION  Blood pressure controlled on metoprolol. GERD  Continue Protonix. RENAL DISEASE, CHRONIC, stage II  Creatinine stable. GFR 70-90. CEREBROVASCULAR ACCIDENT, HX OF  Residual right hemiparesis. Continue risk factor modification. Changes in skin texture  Scleroderma ruled out with serological testing. Hyponatremia  Likely from dehydration. Urine osmolality low at 163 and sodium 53. 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 Being hydrated. Sodium trending up. Normocytic anemia  Likely anemia of chronic disease. Baseline hemoglobin 12-13 mg/dL. Edema of right lower extremity  Doppler studies negative for DVT or SVT. Insomnia  Continue Topamax. Coronary artery disease  Continue aspirin, beta blocker. Not currently on statin therapy.  Code Status: Full.  Family Communication: Son over the phone  Medical Consultants:  Psychiatry - determined that pt is compete net to make decisions  Telephone consult with Dr. Su Grand, Urology Other Consultants:  Occupational therapy: Mild cognitive deficits. Unsafe at home.  Physical therapy: Skilled nursing home placement recommended. Anti-infectives:  Rocephin 08/16/12---> 12/11 Ciprofloxacin 12/10 --> upon  discharge  Discharge  Exam: Filed Vitals:   08/20/12 0440  BP: 113/58  Pulse: 64  Temp: 97.4 F (36.3 C)  Resp: 18   Filed Vitals:   08/19/12 0615 08/19/12 1455 08/19/12 2050 08/20/12 0440  BP: 136/69 119/66 106/55 113/58  Pulse: 56 54 85 64  Temp: 97.6 F (36.4 C) 97.7 F (36.5 C) 98.4 F (36.9 C) 97.4 F (36.3 C)  TempSrc: Oral Oral Oral Oral  Resp: 16 18 18 18   Height:      Weight:      SpO2: 98% 100% 100% 98%    General: Pt is alert, follows commands appropriately, not in acute distress Cardiovascular: Regular rate and rhythm, S1/S2 +, no murmurs, no rubs, no gallops Respiratory: Clear to auscultation bilaterally, no wheezing, no crackles, no rhonchi Abdominal: Soft, non tender, non distended, bowel sounds +, no guarding Extremities: no edema, no cyanosis, pulses palpable bilaterally DP and PT Neuro: Grossly nonfocal, oriented to name only  Discharge Instructions  Discharge Orders    Future Orders Please Complete By Expires   Diet - low sodium heart healthy      Increase activity slowly          Medication List     As of 08/20/2012 11:37 AM    TAKE these medications         acetaminophen 500 MG tablet   Commonly known as: TYLENOL   Take 500 mg by mouth every 6 (six) hours as needed. As needed for pain      aspirin 81 MG tablet   Take 81 mg by mouth daily.      ciprofloxacin 250 MG tablet   Commonly known as: CIPRO   Take 1 tablet (250 mg total) by mouth 2 (two) times daily.      GENERLAC 10 GM/15ML Soln   Generic drug: lactulose (encephalopathy)   Take 10 g by mouth daily as needed. Constipation      HYDROcodone-acetaminophen 5-325 MG per tablet   Commonly known as: NORCO/VICODIN   Take 1-2 tablets by mouth every 4 (four) hours as needed.      metoprolol tartrate 25 MG tablet   Commonly known as: LOPRESSOR   Take 25 mg by mouth 2 (two) times daily.      omeprazole 20 MG capsule   Commonly known as: PRILOSEC   Take 2 capsules (40 mg total)  by mouth daily.      ondansetron 4 MG tablet   Commonly known as: ZOFRAN   Take 1 tablet (4 mg total) by mouth every 6 (six) hours as needed for nausea.      Tamsulosin HCl 0.4 MG Caps   Commonly known as: FLOMAX   Take 0.4 mg by mouth at bedtime.      topiramate 100 MG tablet   Commonly known as: TOPAMAX   Take 100 mg by mouth at bedtime.           Follow-up Information    Follow up with Rene Paci, MD. In 2 weeks.   Contact information:   [address] [place] [address] [phone]       Follow up with NESI,MARC-HENRY, MD. In 2 weeks.   Contact information:   [address] [place] [address] [phone]  The results of significant diagnostics from this hospitalization (including imaging, microbiology, ancillary and laboratory) are listed below for reference.     Microbiology: Recent Results (from the past 240 hour(s))  URINE CULTURE     Status: Normal   Collection Time   08/16/12 11:09 AM      Component Value Range Status Comment   Specimen Description URINE, CATHETERIZED   Final    Special Requests NONE   Final    Culture  Setup Time 08/16/2012 19:31   Final    Colony Count >=100,000 COLONIES/ML   Final    Culture KLEBSIELLA OXYTOCA   Final    Report Status 08/18/2012 FINAL   Final    Organism ID, Bacteria KLEBSIELLA OXYTOCA   Final      Labs: Basic Metabolic Panel:  Lab 08/17/12 1610 08/16/12 1139 08/16/12 1126  NA 132* 126* 124*  K 3.8 4.4 4.1  CL 99 96 91*  CO2 25 -- 22  GLUCOSE 93 86 86  BUN 10 4* 6  CREATININE 1.03 0.90 0.82  CALCIUM 8.5 -- 8.9  MG -- -- --  PHOS -- -- --   Liver Function Tests:  Lab 08/16/12 1126  AST 33  ALT 16  ALKPHOS 75  BILITOT 0.4  PROT 6.5  ALBUMIN 3.5   CBC:  Lab 08/17/12 0506 08/16/12 1139 08/16/12 1126  WBC 4.6 -- 7.3  NEUTROABS -- -- 6.0  HGB 11.5* 12.9* 12.9*  HCT 32.5* 38.0* 35.7*  MCV 95.0 --  93.5  PLT 270 -- 297    SIGNED: Time coordinating discharge: Over 30 minutes  Debbora Presto, MD  [facility] 08/20/2012, 11:37 AM Pager [phone]  If 7PM-7AM, please contact night-coverage [email or web address] Password [email or web address]

## 2012-08-20 NOTE — Progress Notes (Signed)
Pt's son selected [facility], referral given to in house rep. Eunice Blase, RN with [facility].

## 2012-08-20 NOTE — Progress Notes (Signed)
Occupational Therapy Treatment Patient Details Name: Terry Villa MRN: [id number] DOB: April 25, 1946 Today's Date: 08/20/2012 Time: 0865-7846 OT Time Calculation (min): 32 min  OT Assessment / Plan / Recommendation Comments on Treatment Session Pt.  continues to require +2 assist for functional transfers and ADLs due to inability to move sit to stand without total A +2.  Pt. with very poor safety awareness and judgement.  He continues to insist that he can return home, and insists that his catheter is what is limiting him.  He also insists that it be removed.  Pt. clothing in closet that he wore into hospital reeks of urine, and upon questioning it sounds like he was marginally functional at home, and has declined further functionally.  Pt  is UNSAFE to discharge home alone - requires total assist for 2 people for basic transfers (pt. is non-ambulatory).    Follow Up Recommendations  SNF    Barriers to Discharge       Equipment Recommendations  None recommended by OT    Recommendations for Other Services    Frequency Min 2X/week   Plan Discharge plan remains appropriate    Precautions / Restrictions Precautions Precautions: Fall Precaution Comments: Pt. reports falls at home Required Braces or Orthoses: Other Brace/Splint Other Brace/Splint: AFO for RLE secondary to old CVA Restrictions Weight Bearing Restrictions: No   Pertinent Vitals/Pain     ADL  Lower Body Bathing: Simulated;+2 Total assistance Lower Body Bathing: Patient Percentage: 20% (for sit to stand.  Pt. able to bathe LEs, but assist for per) Where Assessed - Lower Body Bathing: Supported sit to stand Lower Body Dressing: Performed;+2 Total assistance Lower Body Dressing: Patient Percentage: 20% (for sit to stand; pt. able to don socks and shoes) Where Assessed - Lower Body Dressing: Supported sit to stand Toilet Transfer: Chief of Staff: Patient Percentage: 10% Statistician Method:  Surveyor, minerals: Materials engineer and Hygiene: Performed;+2 Total assistance Toileting - Architect and Hygiene: Patient Percentage: 0% Where Assessed - Engineer, mining and Hygiene: Sit to stand from 3-in-1 or toilet Equipment Used: Gait belt;Rolling walker Transfers/Ambulation Related to ADLs: Pt. reports he can't stand do to catheter; however, pt. demonstrates inabililty to fully extend, and only able to assist ~20% of activity.  Pt. transferred to recliner and assisted only minimally ADL Comments: Pt. able to don socks and shoes with significant amount of time, and effort.  Clothing in closet are dirty and smell strongly of urine.  Pt. incontinent of bowel with no awareness and unable to assist with peri hygiene.  Pt is insistent that he is going home, and his limitations are due to catheter.  He is also insistent that catheter be removed.  Pt. reports that his neighbor assists with IADLs.  Prior status sounds as it pt. was functioning marginally at home PTA    OT Diagnosis:    OT Problem List:   OT Treatment Interventions:     OT Goals ADL Goals ADL Goal: Grooming - Progress: Not progressing ADL Goal: Lower Body Bathing - Progress: Not progressing ADL Goal: Lower Body Dressing - Progress: Not progressing ADL Goal: Toilet Transfer - Progress: Not progressing ADL Goal: Toileting - Clothing Manipulation - Progress: Not progressing ADL Goal: Toileting - Hygiene - Progress: Not progressing ADL Goal: Tub/Shower Transfer - Progress: Not progressing  Visit Information  Last OT Received On: 08/20/12 Assistance Needed: +2 PT/OT Co-Evaluation/Treatment: Yes    Subjective Data  Prior Functioning       Cognition  Overall Cognitive Status: Impaired Area of Impairment: Safety/judgement;Memory;Awareness of deficits Arousal/Alertness: Awake/alert Behavior During Session: Flat affect Memory Deficits: Pt.  provides inconsistent responses to questions about home environment Safety/Judgement: Decreased safety judgement for tasks assessed;Decreased awareness of need for assistance Safety/Judgement - Other Comments: Pt. continues to insist that he is going home, and that he does not need rehab although it required 2+ assist to transfer to the recliner, and pt. unable to ambulate at this time.  Pt. feels like his catheter is his limiting factor Awareness of Deficits: poor awareness of deficits Cognition - Other Comments: Pt. slow to process information; memory inconsistent and pt. with very poor safety awareness and judgement    Mobility  Shoulder Instructions Bed Mobility Bed Mobility: Supine to Sit;Sitting - Scoot to Edge of Bed Supine to Sit: 3: Mod assist Sitting - Scoot to Edge of Bed: 4: Min assist Details for Bed Mobility Assistance: mod A to lift trunk from bed Transfers Transfers: Sit to Stand;Stand to Sit Sit to Stand: From bed;With upper extremity assist;1: +2 Total assist Sit to Stand: Patient Percentage: 20% Stand to Sit: 1: +2 Total assist;With upper extremity assist;To bed;To chair/3-in-1 Stand to Sit: Patient Percentage: 20% Details for Transfer Assistance: Pt. requires assist to move into standing position and to extend hips and knees.  Pt. sits randomly without warning and requires +2 assist to make sure that he is sitting back far enough on bed to prevent sliding off edge       Exercises      Balance Balance Balance Assessed: Yes Dynamic Sitting Balance Dynamic Sitting - Balance Support: No upper extremity supported;During functional activity Dynamic Sitting - Level of Assistance: 5: Stand by assistance Dynamic Sitting - Balance Activities:  (donning socks and shoes)   End of Session OT - End of Session Equipment Utilized During Treatment: Gait belt;Right ankle foot orthosis Activity Tolerance: Patient limited by pain Patient left: in chair;with call bell/phone within  reach Nurse Communication: Mobility status  GO     Terry Villa M 08/20/2012, 12:16 PM

## 2012-08-20 NOTE — Progress Notes (Signed)
Occupational Therapy Note  08/20/12 1248  OT Visit Information  Last OT Received On 08/20/12  Assistance Needed +2  OT Time Calculation  OT Start Time 1238  OT Stop Time 1248  OT Time Calculation (min) 10 min  Precautions  Precautions Fall  Precaution Comments Pt. reports falls at home  Required Braces or Orthoses Other Brace/Splint  Other Brace/Splint AFO for RLE secondary to old CVA  Restrictions  Weight Bearing Restrictions No  ADL  Toilet Transfer Simulated;+2 Total assistance  Toilet Transfer: Patient Percentage 10%  Toilet Transfer Method Stand pivot  Toilet Transfer Equipment Other (comment) (recliner to bed)  Transfers/Ambulation Related to ADLs RN requested assist to transfer pt. back to bed.  Pt. required total A  + 2 (pt ~10%).  Pt. attempted to sit before reaching the bed, and required total support to prevent fall.   ADL Comments Deficits pointed out to pt. and reports the reason for his difficulty is that he sat in the chair for an hour, and will never sit that long.  Tried to reason with pt. and explain need for 24 hour assist or SNF, but pt. argumentative and insisting that he can discharge home, and take care of himself.  Pt. also continues to state that the reason he can't ambulate is due to foley.  Explained need for foley and that foley will be discharging with him, and he became irrate insisting the foley be taken out, or he was going to sue someone.  Pt. with no recollection of discussion with MD re: need for foley.  Cognition  Overall Cognitive Status Impaired  Area of Impairment Safety/judgement;Memory;Awareness of deficits  Arousal/Alertness Awake/alert  Behavior During Session Agitated  Memory Deficits No recollection of need for foley cath  Safety/Judgement Decreased safety judgement for tasks assessed;Decreased awareness of need for assistance  Safety/Judgement - Other Comments continues with inability to generalize perfomance in hospital to how he will  function at home.  Insists he can ambulate and transfer independently  Awareness of Deficits poor awareness of deficits  Cognition - Other Comments Pt. slow to process information; memory inconsistent and pt. with very poor safety awareness and judgement  Bed Mobility  Bed Mobility Sit to Supine  Sit to Supine With rail;HOB flat;5: Supervision  Details for Bed Mobility Assistance Pt. heavily dependent on rail.  States he doesn't have a hospital bed, but has an Electronics engineer bed. He was unable to describe what that entailed  Transfers  Transfers Sit to Stand;Stand to Sit  Sit to Stand 1: +2 Total assist;With upper extremity assist;From chair/3-in-1  Sit to Stand: Patient Percentage 20%  Stand to Sit 1: +2 Total assist;With upper extremity assist;To bed  Stand to Sit: Patient Percentage 10%  Details for Transfer Assistance see comments under ADL section  OT - End of Session  Equipment Utilized During Treatment Right ankle foot orthosis  Activity Tolerance Patient tolerated treatment well  Patient left in bed;with call bell/phone within reach;with nursing in room  Nurse Communication Other (comment) (MD informed of pt status)  OT Assessment/Plan  Comments on Treatment Session Pt. with poor awareness of deficits and impact on function.  Insists he can ambulate and perform transfers independently despite needing total A +2  OT Plan Discharge plan remains appropriate  OT Frequency Min 2X/week  Follow Up Recommendations SNF  OT Equipment None recommended by OT  ADL Goals  ADL Goal: Grooming - Progress Not progressing  ADL Goal: Lower Body Bathing - Progress Not progressing  ADL  Goal: Lower Body Dressing - Progress Not progressing  ADL Goal: Toilet Transfer - Progress Not progressing  ADL Goal: Toileting - Clothing Manipulation - Progress Not progressing  ADL Goal: Tub/Shower Transfer - Progress Not progressing  OT General Charges  $OT Visit 1 Procedure  OT Treatments  $Therapeutic Activity 8-22  mins

## 2012-08-20 NOTE — Progress Notes (Addendum)
Clinical Social Worker followed up with pt in regard to need for SNF placement. Pt continues to refuse SNF placement and feels that he can care for himself at home. Awaiting note from psych consult to ensure that pt has the competency to make that decision. Clinical Social Worker notified MD. Clinical Social Worker to discuss with RNCM. Clinical Social Worker to continue to follow and assist as appropriate.  Addendum: Clinical Social Worker notified from MD that pt does have competency to make his decisions. MD and RN have spoken to pt son who is willing to have pt come stay with pt son for some time since pt is not agreeable to SNF at this time. RNCM arranged home care services for pt at pt son's home including social work follow up with home health service. Clinical Social Worker completed ambulance form and RN plans to arrange ambulance transportation for pt to pt son's home. No further social work needs identified at this time. Clinical Social Worker signing off.   Jacklynn Lewis, MSW, LCSWA  Clinical Social Work [phone]

## 2012-08-20 NOTE — Progress Notes (Signed)
Physical Therapy Treatment Patient Details Name: Terry Villa MRN: [id number] DOB: Nov 01, 1945 Today's Date: 08/20/2012 Time: 4098-1191 PT Time Calculation (min): 32 min  PT Assessment / Plan / Recommendation Comments on Treatment Session  Pt. is requiring 2 person assist for transfers. Pt. clearly is not at a functioning level to return home alone. Pt. was incontinent of BM and was unaware. Pt's clothes  and shoes in the closet wreak of urine. MD aware of pt's function and inabilty to demonstrate taking care of himself.    Follow Up Recommendations  SNF     Does the patient have the potential to tolerate intense rehabilitation     Barriers to Discharge        Equipment Recommendations  None recommended by PT    Recommendations for Other Services    Frequency Min 3X/week   Plan Discharge plan remains appropriate;Frequency remains appropriate    Precautions / Restrictions Precautions Precautions: Fall Precaution Comments: Pt. reports falls at home Required Braces or Orthoses: Other Brace/Splint Other Brace/Splint: AFO for RLE secondary to old CVA Restrictions Weight Bearing Restrictions: No   Pertinent Vitals/Pain    Mobility  Bed Mobility Bed Mobility: Supine to Sit;Sitting - Scoot to Edge of Bed Supine to Sit: 3: Mod assist Sitting - Scoot to Edge of Bed: 4: Min assist Details for Bed Mobility Assistance: mod A to lift trunk from bed Transfers Sit to Stand: From bed;With upper extremity assist;1: +2 Total assist Sit to Stand: Patient Percentage: 20% Stand to Sit: 1: +2 Total assist;With upper extremity assist;To bed;To chair/3-in-1 Stand to Sit: Patient Percentage: 20% Details for Transfer Assistance: Pt. requires assist to move into standing position and to extend hips and knees. Pt. sits randomly without warning and requires +2 assist to make sure that he is sitting back far enough on bed to prevent sliding off edge  Ambulation/Gait Ambulation/Gait Assistance: Not  tested (comment) Ambulation/Gait Assistance Details: pt  demonstrated poor standing ability    Exercises     PT Diagnosis:    PT Problem List:   PT Treatment Interventions:     PT Goals Acute Rehab PT Goals Pt will go Supine/Side to Sit: with min assist;with HOB not 0 degrees (comment degree) PT Goal: Supine/Side to Sit - Progress: Progressing toward goal Pt will go Sit to Stand: with min assist;with upper extremity assist PT Goal: Sit to Stand - Progress: Progressing toward goal  Visit Information  Last PT Received On: 08/20/12 Assistance Needed: +2    Subjective Data  Subjective: I need this tube out. I am not going to rehab.   Cognition  Overall Cognitive Status: Impaired Area of Impairment: Safety/judgement;Memory;Awareness of deficits Arousal/Alertness: Awake/alert Behavior During Session: Flat affect Memory Deficits: Pt. provides inconsistent responses to questions about home environment Safety/Judgement: Decreased safety judgement for tasks assessed;Decreased awareness of need for assistance Safety/Judgement - Other Comments: Pt. continues to insist that he is going home, and that he does not need rehab although it required 2+ assist to transfer to the recliner, and pt. unable to ambulate at this time.  Pt. feels like his catheter is his limiting factor Awareness of Deficits: poor awareness of deficits Cognition - Other Comments: Pt. slow to process information; memory inconsistent and pt. with very poor safety awareness and judgement    Balance  Balance Balance Assessed: Yes Dynamic Sitting Balance Dynamic Sitting - Balance Support: No upper extremity supported;During functional activity Dynamic Sitting - Level of Assistance: 5: Stand by assistance Dynamic Sitting Balance - Compensations:  posture with significant kyphosis, leans posteriorly to comp. for balance while donning shoe. Dynamic Sitting - Balance Activities:  (donning socks and shoes)  End of Session PT -  End of Session Equipment Utilized During Treatment: Gait belt Activity Tolerance: Patient tolerated treatment well Patient left: in chair Nurse Communication: Mobility status   GP     Rada Hay 08/20/2012, 1:13 PM

## 2012-08-22 ENCOUNTER — Telehealth: Payer: Self-pay | Admitting: Internal Medicine

## 2012-08-22 NOTE — Telephone Encounter (Signed)
Terry Villa, Is it that the bag hasn't been emptied or that he has not urinated since he left the hospital? Also, do you know how frequently home health is visiting him now? Terry Villa

## 2012-08-22 NOTE — Telephone Encounter (Signed)
MD out of office. Pls advise,,,,/lmb

## 2012-08-22 NOTE — Telephone Encounter (Signed)
Called PT back Terry Villa) she states the bag was full of urine. He was d/c on Wednesday. She see him for PT 3'x a week, but need nursing to come out and help change catheter because family not able to help, also just too make sure he is taking his meds correctly...[name]

## 2012-08-22 NOTE — Telephone Encounter (Signed)
Francoise Schaumann to have home health nurse 3 x per week.  Rene Kocher

## 2012-08-22 NOTE — Telephone Encounter (Signed)
The patient's home health nurse called the triage line and is concerned about the patient's care.  She states his catheter had not be emptied since he had left the hospital, and she is also concerned about him taking his correct medication.  She is hoping for a verbal order to have a home health nurse come out to check on him more often.  Her callback number is [phone]

## 2012-08-22 NOTE — Telephone Encounter (Signed)
Notified [name] with NP response...[name]

## 2012-08-28 ENCOUNTER — Telehealth: Payer: Self-pay | Admitting: *Deleted

## 2012-08-28 NOTE — Telephone Encounter (Signed)
ok 

## 2012-08-28 NOTE — Telephone Encounter (Signed)
[facility] OT calling for verbal order for home OT 2x/week x 4 weeks.

## 2012-08-28 NOTE — Telephone Encounter (Signed)
[facility] informed via VM of verbal order.

## 2012-08-29 ENCOUNTER — Telehealth: Payer: Self-pay | Admitting: General Practice

## 2012-08-29 NOTE — Telephone Encounter (Signed)
Called multiple numbers and left messages for patient to call office to schedule hospital f/u visit.

## 2012-09-01 ENCOUNTER — Telehealth: Payer: Self-pay | Admitting: General Practice

## 2012-09-01 NOTE — Telephone Encounter (Signed)
Transitional Care call:  Spoke with patient's son.  Son stated that he had no questions in regard to hospital discharge instructions or medications.  Son did state that pt fell yesterday but seems to be OK and did not feel that pt needed to be checked out.  Patient is using wheelchair to get around.   HH Genevieve Norlander) is going to the home. Son denied having any questions to direct to Dr. Felicity Coyer.  Follow up appointment made.

## 2012-09-01 NOTE — Telephone Encounter (Signed)
LMOM for patient to call office to schedule hospital f/u visit.

## 2012-09-05 DIAGNOSIS — T66XXXA Radiation sickness, unspecified, initial encounter: Secondary | ICD-10-CM

## 2012-09-05 DIAGNOSIS — K6289 Other specified diseases of anus and rectum: Secondary | ICD-10-CM

## 2012-09-05 DIAGNOSIS — N3020 Other chronic cystitis without hematuria: Secondary | ICD-10-CM

## 2012-09-05 DIAGNOSIS — Z8546 Personal history of malignant neoplasm of prostate: Secondary | ICD-10-CM

## 2012-09-05 DIAGNOSIS — I69959 Hemiplegia and hemiparesis following unspecified cerebrovascular disease affecting unspecified side: Secondary | ICD-10-CM

## 2012-09-05 DIAGNOSIS — N390 Urinary tract infection, site not specified: Secondary | ICD-10-CM

## 2012-09-17 ENCOUNTER — Ambulatory Visit: Payer: Medicare Other | Admitting: Internal Medicine

## 2012-09-17 ENCOUNTER — Telehealth: Payer: Self-pay | Admitting: Internal Medicine

## 2012-09-17 NOTE — Telephone Encounter (Signed)
Notified Rinaldo Cloud with [facility]

## 2012-09-17 NOTE — Telephone Encounter (Signed)
Terry Villa, Ok to verbal order social work to go see patient. Rene Kocher

## 2012-09-17 NOTE — Telephone Encounter (Signed)
Rinaldo Cloud, the home health nurse, is requesting an order for a social worker to go out and see the patient due to some personal issues the patient is having, call Rinaldo Cloud @ [phone]

## 2012-09-23 ENCOUNTER — Telehealth: Payer: Self-pay | Admitting: *Deleted

## 2012-09-23 NOTE — Telephone Encounter (Signed)
No urine/UA needed - please have RN reevaluate on Friday 1/17

## 2012-09-23 NOTE — Telephone Encounter (Signed)
Home health nurse Rinaldo Cloud notified of no need for UA at this time. Instructed per Dr. Felicity Coyer to reevaluate on Friday.

## 2012-09-23 NOTE — Telephone Encounter (Signed)
Nurse at home site of patient now and states there is blood in urine and the tip of penis is very red. Patient does have difficulty moving about. Do you want the nurse to get a UA sample now while she is there.? Please advise

## 2012-09-30 ENCOUNTER — Telehealth: Payer: Self-pay | Admitting: *Deleted

## 2012-09-30 NOTE — Telephone Encounter (Signed)
Left msg on vm requesting call bck concerning pt. Called nurse bck she stated pt has been complaining of cough with congestion. Coughing up brownish phlegm. Did inform nurse pt will need OV. Spoke with nurse last week pt has no show twice md will need to see pt before something can be call to pharmacy...[name]

## 2012-10-01 ENCOUNTER — Encounter: Payer: Medicare Other | Admitting: Internal Medicine

## 2012-11-28 ENCOUNTER — Other Ambulatory Visit: Payer: Self-pay | Admitting: *Deleted

## 2012-11-28 MED ORDER — OMEPRAZOLE 20 MG PO CPDR: DELAYED_RELEASE_CAPSULE | ORAL | Status: DC

## 2012-12-10 ENCOUNTER — Ambulatory Visit: Payer: Self-pay | Admitting: Internal Medicine

## 2012-12-23 ENCOUNTER — Other Ambulatory Visit: Payer: Self-pay | Admitting: Internal Medicine

## 2012-12-23 ENCOUNTER — Other Ambulatory Visit: Payer: Self-pay | Admitting: Nurse Practitioner

## 2013-01-30 ENCOUNTER — Other Ambulatory Visit: Payer: Self-pay | Admitting: Internal Medicine

## 2013-02-21 ENCOUNTER — Other Ambulatory Visit: Payer: Self-pay | Admitting: Internal Medicine

## 2013-03-03 ENCOUNTER — Other Ambulatory Visit: Payer: Self-pay | Admitting: Internal Medicine

## 2013-04-02 ENCOUNTER — Other Ambulatory Visit: Payer: Self-pay | Admitting: Nurse Practitioner

## 2013-04-02 ENCOUNTER — Other Ambulatory Visit: Payer: Self-pay | Admitting: Internal Medicine

## 2013-05-03 ENCOUNTER — Other Ambulatory Visit: Payer: Self-pay | Admitting: Nurse Practitioner

## 2013-05-03 ENCOUNTER — Other Ambulatory Visit: Payer: Self-pay | Admitting: Internal Medicine

## 2013-06-06 ENCOUNTER — Other Ambulatory Visit: Payer: Self-pay | Admitting: Internal Medicine

## 2013-07-01 IMAGING — CT CT HEAD W/O CM
1 of 2 series · 13 of 30 positions shown, 17 images · non-contrast
Comparison: Unenhanced cranial CT 03/24/2008 [HOSPITAL].

CLINICAL DATA: Generalized weakness.  Fell.  History of stroke.
History of prostate cancer.

CT HEAD WITHOUT CONTRAST 11/03/2011:
TECHNIQUE: Contiguous axial images were obtained from the base of
the skull through the vertex without contrast.

[Series 2: brain · axial · 0.49mm/px · z∈[+119,+249]mm · 13 of 28 slices shown, 17 images]
[im 2/28  brain]
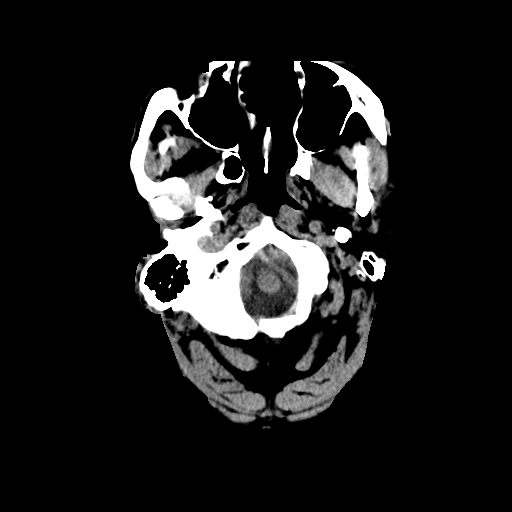
[im 2/28  bone]
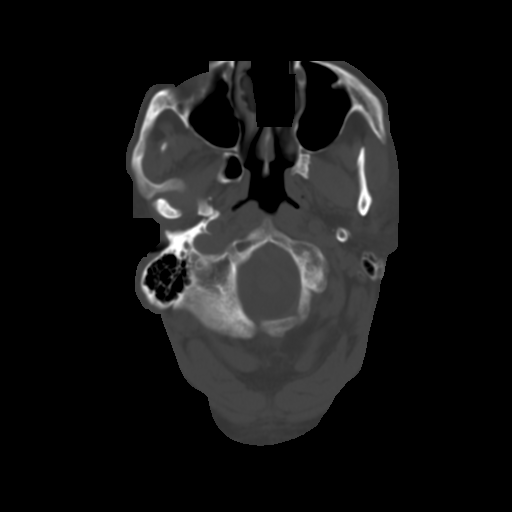
[im 4/28  brain]
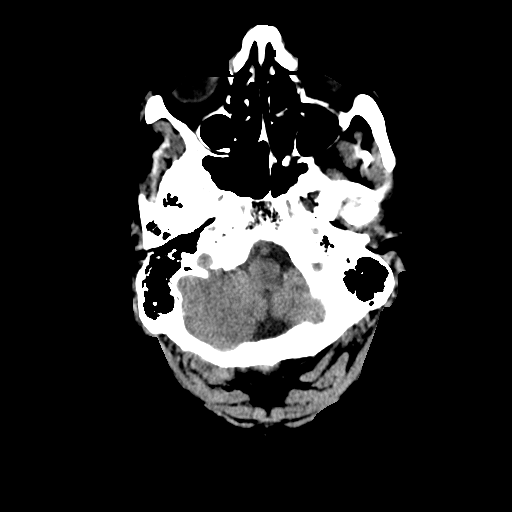
[im 6/28  brain]
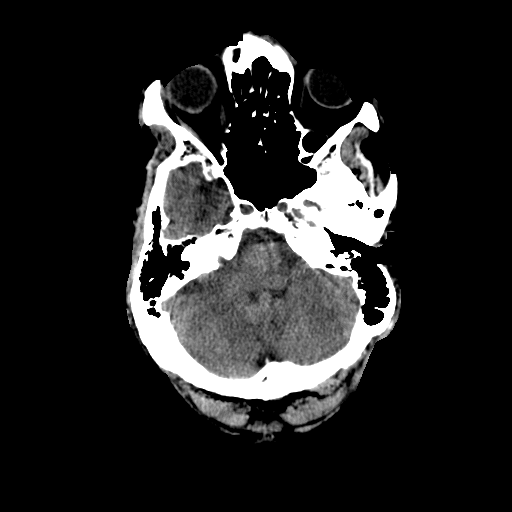
[im 8/28  brain]
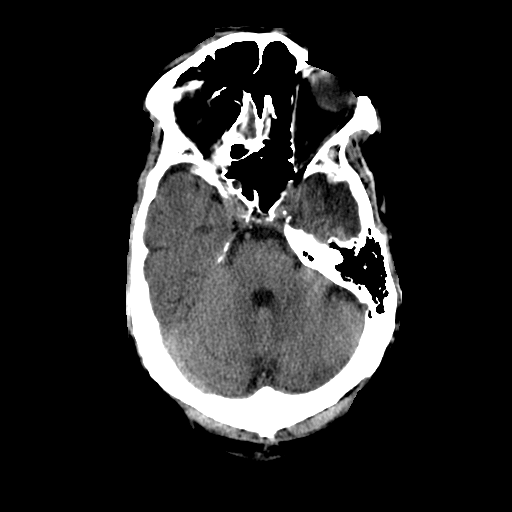
[im 10/28  brain]
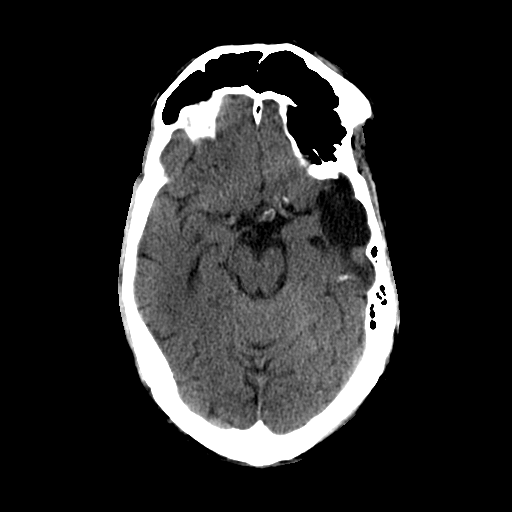
[im 10/28  bone]
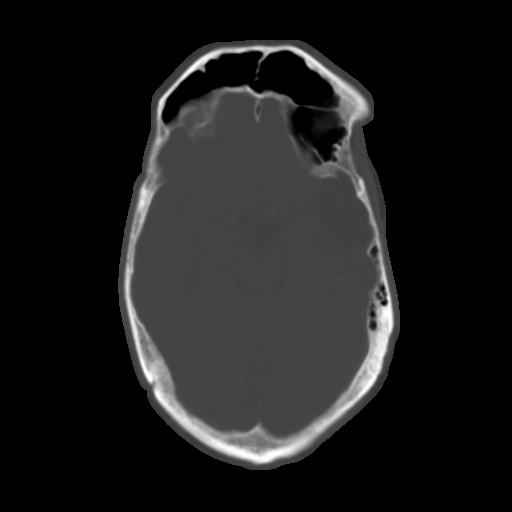
[im 12/28  brain]
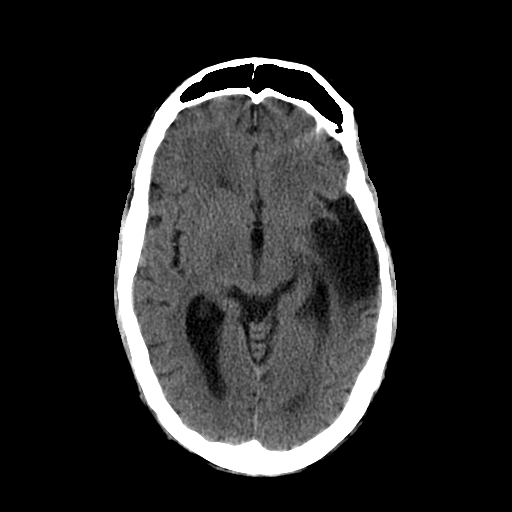
[im 14/28  brain]
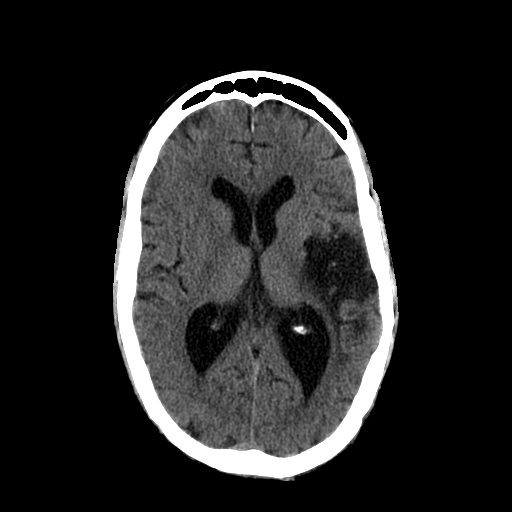
[im 16/28  brain]
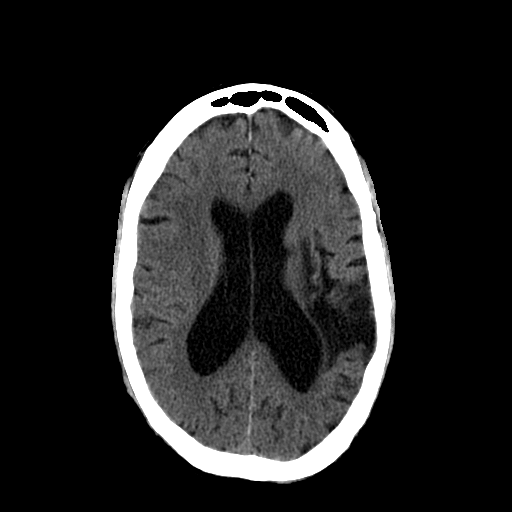
[im 18/28  brain]
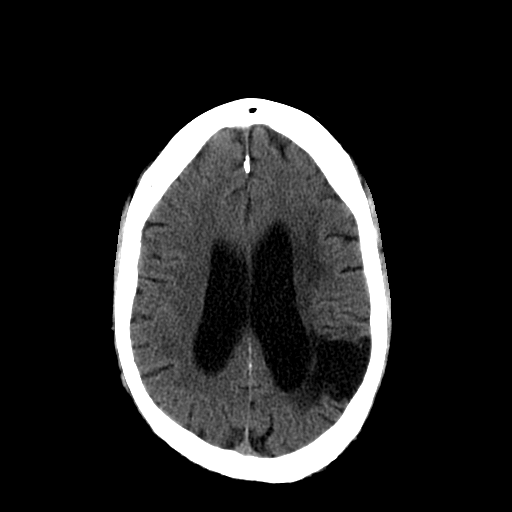
[im 18/28  bone]
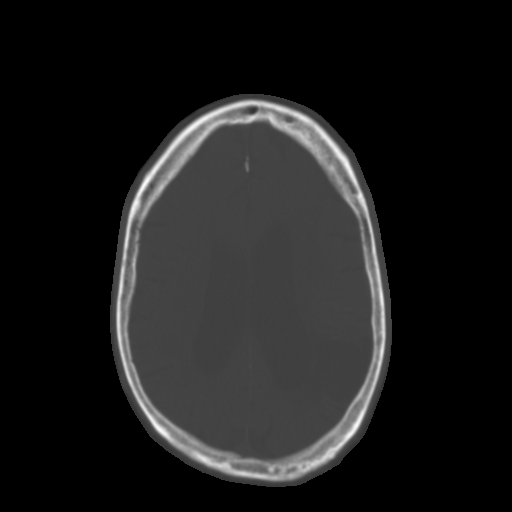
[im 20/28  brain]
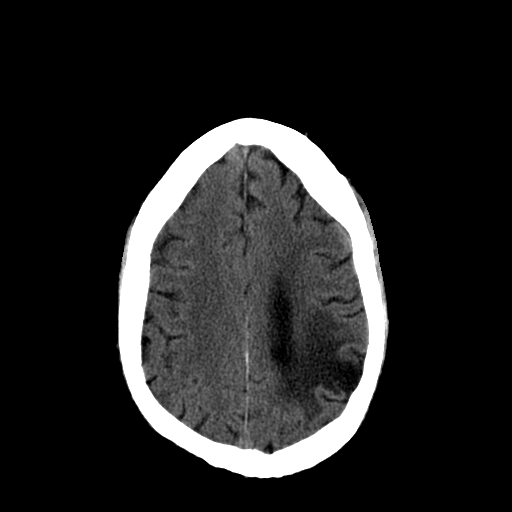
[im 22/28  brain]
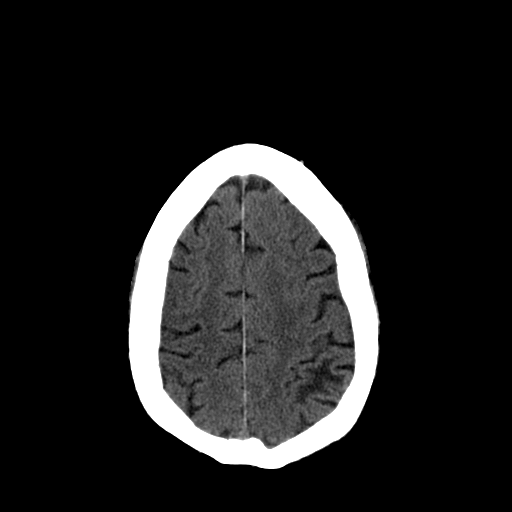
[im 24/28  brain]
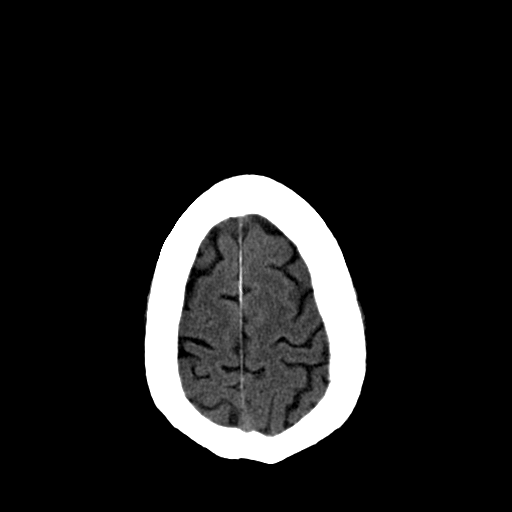
[im 26/28  brain]
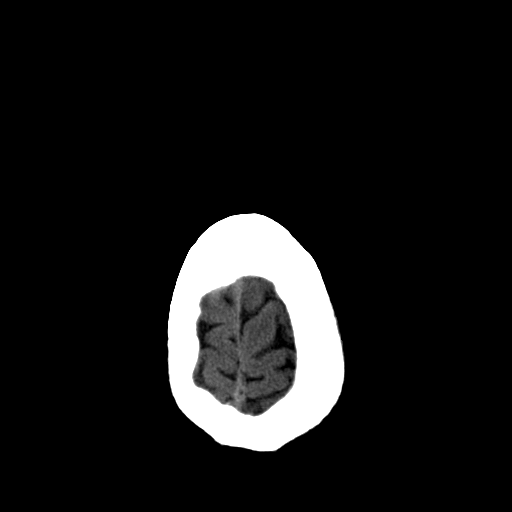
[im 26/28  bone]
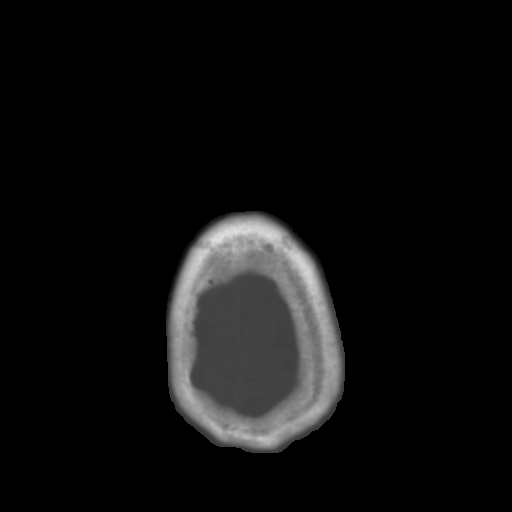

[13 of 30 positions shown; findings below may reference images not displayed]

FINDINGS: Encephalomalacia involving the left temporal and parietal
lobes, consistent with an old left middle cerebral artery
distribution stroke, unchanged.  Mild cortical atrophy and moderate
deep atrophy, unchanged.  Mild changes of small vessel disease of
the white matter, unchanged.  No mass lesion.  No midline shift.
No acute hemorrhage or hematoma.  No extra-axial fluid collections.
No evidence of acute infarction.  No significant interval change.

No focal osseous abnormality involving the skull.  Visualized
paranasal sinuses, mastoid air cells, and middle ear cavities well-
aerated.  Bilateral carotid siphon atherosclerosis.
IMPRESSION: 1.  No acute intracranial abnormality.
2.  Stable old left middle cerebral artery distribution stroke.
3.  Stable mild to moderate generalized atrophy and mild chronic
microvascular ischemic changes of the white matter.

## 2013-07-14 ENCOUNTER — Other Ambulatory Visit: Payer: Self-pay | Admitting: Internal Medicine

## 2013-07-19 ENCOUNTER — Other Ambulatory Visit: Payer: Self-pay | Admitting: Internal Medicine

## 2013-08-30 ENCOUNTER — Other Ambulatory Visit: Payer: Self-pay | Admitting: Internal Medicine

## 2013-09-04 ENCOUNTER — Other Ambulatory Visit: Payer: Self-pay | Admitting: Internal Medicine

## 2013-09-14 ENCOUNTER — Other Ambulatory Visit: Payer: Self-pay | Admitting: Internal Medicine

## 2013-09-22 ENCOUNTER — Encounter: Payer: Self-pay | Admitting: *Deleted

## 2014-03-24 ENCOUNTER — Emergency Department (HOSPITAL_BASED_OUTPATIENT_CLINIC_OR_DEPARTMENT_OTHER)
Admission: EM | Admit: 2014-03-24 | Discharge: 2014-03-24 | Disposition: A | Discharge status: Home / Self Care | Payer: Medicare Other | Attending: Emergency Medicine | Admitting: Emergency Medicine

## 2014-03-24 ENCOUNTER — Encounter (HOSPITAL_BASED_OUTPATIENT_CLINIC_OR_DEPARTMENT_OTHER): Payer: Self-pay | Admitting: Emergency Medicine

## 2014-03-24 DIAGNOSIS — Z886 Allergy status to analgesic agent status: Secondary | ICD-10-CM | POA: Insufficient documentation

## 2014-03-24 DIAGNOSIS — Z8546 Personal history of malignant neoplasm of prostate: Secondary | ICD-10-CM | POA: Diagnosis not present

## 2014-03-24 DIAGNOSIS — N529 Male erectile dysfunction, unspecified: Secondary | ICD-10-CM | POA: Diagnosis not present

## 2014-03-24 DIAGNOSIS — Z8673 Personal history of transient ischemic attack (TIA), and cerebral infarction without residual deficits: Secondary | ICD-10-CM | POA: Diagnosis not present

## 2014-03-24 DIAGNOSIS — N182 Chronic kidney disease, stage 2 (mild): Secondary | ICD-10-CM | POA: Insufficient documentation

## 2014-03-24 DIAGNOSIS — I129 Hypertensive chronic kidney disease with stage 1 through stage 4 chronic kidney disease, or unspecified chronic kidney disease: Secondary | ICD-10-CM | POA: Insufficient documentation

## 2014-03-24 DIAGNOSIS — Z7982 Long term (current) use of aspirin: Secondary | ICD-10-CM | POA: Diagnosis not present

## 2014-03-24 DIAGNOSIS — Z85038 Personal history of other malignant neoplasm of large intestine: Secondary | ICD-10-CM | POA: Insufficient documentation

## 2014-03-24 DIAGNOSIS — K5900 Constipation, unspecified: Secondary | ICD-10-CM | POA: Diagnosis not present

## 2014-03-24 DIAGNOSIS — Z87891 Personal history of nicotine dependence: Secondary | ICD-10-CM | POA: Diagnosis not present

## 2014-03-24 DIAGNOSIS — R109 Unspecified abdominal pain: Secondary | ICD-10-CM | POA: Insufficient documentation

## 2014-03-24 DIAGNOSIS — I252 Old myocardial infarction: Secondary | ICD-10-CM | POA: Insufficient documentation

## 2014-03-24 DIAGNOSIS — Z860100 Personal history of colon polyps, unspecified: Secondary | ICD-10-CM | POA: Insufficient documentation

## 2014-03-24 DIAGNOSIS — I2510 Atherosclerotic heart disease of native coronary artery without angina pectoris: Secondary | ICD-10-CM | POA: Diagnosis not present

## 2014-03-24 DIAGNOSIS — K219 Gastro-esophageal reflux disease without esophagitis: Secondary | ICD-10-CM | POA: Diagnosis not present

## 2014-03-24 DIAGNOSIS — R1084 Generalized abdominal pain: Secondary | ICD-10-CM | POA: Diagnosis present

## 2014-03-24 DIAGNOSIS — Z79899 Other long term (current) drug therapy: Secondary | ICD-10-CM | POA: Insufficient documentation

## 2014-03-24 LAB — CBC
HEMATOCRIT: 39.6 % (ref 39.0–52.0)
HEMOGLOBIN: 13.8 g/dL (ref 13.0–17.0)
MCH: 33.1 pg (ref 26.0–34.0)
MCHC: 34.8 g/dL (ref 30.0–36.0)
MCV: 95 fL (ref 78.0–100.0)
Platelets: 247 10*3/uL (ref 150–400)
RBC: 4.17 MIL/uL — AB (ref 4.22–5.81)
RDW: 12.7 % (ref 11.5–15.5)
WBC: 6.5 10*3/uL (ref 4.0–10.5)

## 2014-03-24 LAB — URINE MICROSCOPIC-ADD ON

## 2014-03-24 LAB — COMPREHENSIVE METABOLIC PANEL
ALBUMIN: 3.7 g/dL (ref 3.5–5.2)
ALT: 16 U/L (ref 0–53)
AST: 21 U/L (ref 0–37)
Alkaline Phosphatase: 78 U/L (ref 39–117)
Anion gap: 13 (ref 5–15)
BILIRUBIN TOTAL: 0.4 mg/dL (ref 0.3–1.2)
BUN: 9 mg/dL (ref 6–23)
CO2: 25 meq/L (ref 19–32)
Calcium: 9.4 mg/dL (ref 8.4–10.5)
Chloride: 95 mEq/L — ABNORMAL LOW (ref 96–112)
Creatinine, Ser: 1 mg/dL (ref 0.50–1.35)
GFR calc non Af Amer: 75 mL/min — ABNORMAL LOW (ref >90)
GFR, EST AFRICAN AMERICAN: 87 mL/min — AB (ref >90)
GLUCOSE: 111 mg/dL — AB (ref 70–99)
POTASSIUM: 4.5 meq/L (ref 3.7–5.3)
Sodium: 133 mEq/L — ABNORMAL LOW (ref 137–147)
Total Protein: 6.8 g/dL (ref 6.0–8.3)

## 2014-03-24 LAB — URINALYSIS, ROUTINE W REFLEX MICROSCOPIC
Bilirubin Urine: NEGATIVE
Glucose, UA: NEGATIVE mg/dL
Ketones, ur: NEGATIVE mg/dL
NITRITE: NEGATIVE
PH: 7 (ref 5.0–8.0)
Protein, ur: NEGATIVE mg/dL
Specific Gravity, Urine: 1.006 (ref 1.005–1.030)
Urobilinogen, UA: 0.2 mg/dL (ref 0.0–1.0)

## 2014-03-24 MED ORDER — FLEET ENEMA 7-19 GM/118ML RE ENEM
1.0000 | ENEMA | Freq: Once | RECTAL | Status: AC
  Administered 2014-03-24: 1 via RECTAL
  Filled 2014-03-24: qty 1

## 2014-03-24 NOTE — Discharge Instructions (Signed)

## 2014-03-24 NOTE — ED Notes (Signed)
C/o upper and lower abd. C/o constipation hx. Feels like  He needs to have a BM. Last BM yesterday.

## 2014-03-24 NOTE — ED Provider Notes (Signed)
CSN: [id number]     Arrival date & time 03/24/14  1731 History   First MD Initiated Contact with Patient 03/24/14 1809     This chart was scribed for Osvaldo Shipper, MD by Forrestine Him, ED Scribe. This patient was seen in room MH04/MH04 and the patient's care was started 6:23 PM.   Chief Complaint  Patient presents with  . Abdominal Pain    Patient is a 68 y.o. male presenting with abdominal pain. The history is provided by the patient. No language interpreter was used.  Abdominal Pain Pain location:  Generalized Pain quality: pressure   Pain radiates to:  Does not radiate Pain severity:  Moderate Timing:  Constant Progression:  Unchanged Chronicity:  New Relieved by:  None tried Worsened by:  Nothing tried Ineffective treatments:  None tried Associated symptoms: constipation   Associated symptoms: no chills, no dysuria, no fever, no nausea and no vomiting     HPI Comments: Terry Villa is a 68 y.o. male with a PMHx of HTN, GERD, coronary artery disease, and prostate cancer who presents to the Emergency Department complaining of constant, moderate abdominal pain x 1 day that is unchanged.  Pt also reports associated constipation. Last bowel movement yesterday. States he is unable to urinate at this time. Last void 3 hours ago.  No vomiting, nausea, fever, chills, diarrhea, dysuria. No lower pelvic or testicular pain. He is not currently on any pain medications. No history of foley catheters. Pt currently lives at home alone. He reports a history of a stroke 7-8 years ago. He has no pertinent past medical history. No other concerns this visit.   Past Medical History  Diagnosis Date  . ADENOCARCINOMA, PROSTATE 06/14/2010    Patient denies receiving tx for Pr CA, but had XRT with proctitis  . DYSLIPIDEMIA 08/26/2009  . HYPERTENSION 08/29/2009  . GERD 08/29/2009  . Chronic kidney disease, stage 2, mildly decreased GFR 08/26/2009  . 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  CARCINOMA, COLON, HX OF 06/14/2010    ??  Patient states he was dx with a blood test, not colonoscopy and he was treated with injections and cured.  States he never received chemo, XRT, or surgery.   . Personal history of colonic polyps 07/19/2010  . TOBACCO USE, QUIT 08/29/2009  . Stroke     Residual right upper and right lower extremity weakness.  . Myocardial infarction 2008    No cath, stents, or surgery per patient history, however, unreliable historian  . Shortness of breath   . Coronary artery disease    Past Surgical History  Procedure Laterality Date  . Liver biopsy      no record of liver biopsy and patient denies  . Colon surgery      patient denies colon surgery  . Colonoscopy, esophagogastroduodenoscopy (egd) and esophageal dilation  10/2011   Family History  Problem Relation Age of Onset  . Breast cancer Mother   . Heart disease Father   . Clotting disorder Maternal Uncle    History  Substance Use Topics  . Smoking status: Former Smoker    Types: Cigarettes    Quit date: 09/10/1988  . Smokeless tobacco: Current User     Comment: Widowed, lives alone-but has lady friend. enjoys spending time with his 7 g-kids  . Alcohol Use: Yes     Comment: occasional    Review of Systems  Constitutional: Negative for fever and chills.  Gastrointestinal: Positive for abdominal pain and constipation. Negative for  nausea and vomiting.  Genitourinary: Negative for dysuria.  All other systems reviewed and are negative.     Allergies  Review of patient's allergies indicates no known allergies.  Home Medications   Prior to Admission medications   Medication Sig Start Date End Date Taking? Authorizing Provider  acetaminophen (TYLENOL) 500 MG tablet Take 500 mg by mouth every 6 (six) hours as needed. As needed for pain    Historical Provider, MD  aspirin 81 MG tablet Take 81 mg by mouth daily.    Historical Provider, MD  ciprofloxacin (CIPRO) 250 MG tablet Take 1 tablet (250  mg total) by mouth 2 (two) times daily. 08/20/12   Theodis Blaze, MD  HYDROcodone-acetaminophen (NORCO/VICODIN) 5-325 MG per tablet Take 1-2 tablets by mouth every 4 (four) hours as needed. 08/20/12   Theodis Blaze, MD  lactulose, encephalopathy, (GENERLAC) 10 GM/15ML SOLN Take 10 g by mouth daily as needed. Constipation    Historical Provider, MD  metoprolol tartrate (LOPRESSOR) 25 MG tablet TAKE ONE TABLET BY MOUTH TWICE DAILY FOR BLOOD PRESSURE...APPOINTMENT OVERDUE NEED TO SCHEDULE A OFFICE VISIT 07/14/13   Blanchie Serve, MD  omeprazole (PRILOSEC) 20 MG capsule TAKE ONE CAPSULE BY MOUTH TWICE DAILY FOR  ACID  REFLUX 05/03/13   Tiffany L Reed, DO  ondansetron (ZOFRAN) 4 MG tablet Take 1 tablet (4 mg total) by mouth every 6 (six) hours as needed for nausea. 08/20/12   Theodis Blaze, MD  tamsulosin (FLOMAX) 0.4 MG CAPS capsule TAKE ONE CAPSULE BY MOUTH EVERY DAY AT BEDTIME 05/03/13   Tiffany L Reed, DO  topiramate (TOPAMAX) 100 MG tablet TAKE ONE TABLET BY MOUTH AT BEDTIME FOR SEIZURES AND MIGRAINE 01/30/13   Blanchie Serve, MD  topiramate (TOPAMAX) 100 MG tablet APPOINTMENT OVERDUE, 1 by mouth at bedtime for seizures and migraines 06/08/13   Blanchie Serve, MD  Vitamin D, Ergocalciferol, (DRISDOL) 50000 UNITS CAPS TAKE ONE CAPSULE BY MOUTH ONCE A WEEK 01/30/13   Blanchie Serve, MD   Triage Vitals: BP 152/107  Pulse 81  Temp(Src) 98.1 F (36.7 C) (Oral)  Resp 18  Wt 135 lb (61.236 kg)  SpO2 100%    Physical Exam  Nursing note and vitals reviewed. Constitutional: He is oriented to person, place, and time. He appears well-developed and well-nourished.  HENT:  Head: Normocephalic and atraumatic.  Mouth/Throat: Oropharynx is clear and moist.  Eyes: EOM are normal.  Neck: Normal range of motion.  Cardiovascular: Normal rate, regular rhythm, normal heart sounds and intact distal pulses.   Pulmonary/Chest: Effort normal and breath sounds normal. No respiratory distress.  Abdominal: Soft. He exhibits  distension (suprapubic). There is tenderness (suprapubic, lower). There is no rebound and no guarding.  Genitourinary: Rectal exam shows tenderness. Rectal exam shows no internal hemorrhoid. Prostate is tender. Prostate is not enlarged.  Soft stool, no impaction  Musculoskeletal: Normal range of motion.  Neurological: He is alert and oriented to person, place, and time.  Skin: Skin is warm and dry.  Psychiatric: He has a normal mood and affect. Judgment normal.    ED Course  Procedures (including critical care time)  DIAGNOSTIC STUDIES: Oxygen Saturation is 100% on RA, Normal by my interpretation.    COORDINATION OF CARE: 6:30 PM- Will give enema and perform in and out cath. Discussed treatment plan with pt at bedside and pt agreed to plan.     Labs Review Labs Reviewed  CBC - Abnormal; Notable for the following:    RBC 4.17 (*)  All other components within normal limits  COMPREHENSIVE METABOLIC PANEL - Abnormal; Notable for the following:    Sodium 133 (*)    Chloride 95 (*)    Glucose, Bld 111 (*)    GFR calc non Af Amer 75 (*)    GFR calc Af Amer 87 (*)    All other components within normal limits  URINALYSIS, ROUTINE W REFLEX MICROSCOPIC - Abnormal; Notable for the following:    APPearance CLOUDY (*)    Hgb urine dipstick LARGE (*)    Leukocytes, UA TRACE (*)    All other components within normal limits  URINE MICROSCOPIC-ADD ON - Abnormal; Notable for the following:    Squamous Epithelial / LPF FEW (*)    All other components within normal limits  URINE CULTURE    Imaging Review No results found.   EKG Interpretation None      MDM   Final diagnoses:  Constipation, unspecified constipation type    Patient here with obstipation and abdominal pain. History of prostate cancer. Had similar episode to this in 2013, had bladder obstruction and a UTI. Patient here with abdominal pain, and decreased urination. Hasn't urinated in 3 hours. Last BM yesterday. No  fevers or vomiting. Patient is a poor historian. On exam he has distention in his lower abdomen. He has soft stool in the vault, no signs of impaction. We'll give enema. After review his old records, will check labs because last time he is hyponatremic and had a UTI with a similar presentation. Labs ok, feeling better after large bowel movement. Stable for discharge.  I personally performed the services described in this documentation, which was scribed in my presence. The recorded information has been reviewed and is accurate.    Osvaldo Shipper, MD 03/25/14 [phone]

## 2014-03-26 LAB — URINE CULTURE
CULTURE: NO GROWTH
Colony Count: NO GROWTH
Special Requests: NORMAL

## 2014-05-10 ENCOUNTER — Telehealth: Payer: Self-pay | Admitting: Internal Medicine

## 2014-05-10 NOTE — Telephone Encounter (Signed)
Pt called in and said that he needed his home health nurse to come back in because he can not get in and out of bath anymore alone.  Please advise Best number is [phone]

## 2014-05-11 NOTE — Telephone Encounter (Signed)
Pt has not seen Dr. Asa Lente since 2011. Need to be establish with new pcp for referrals...Terry Villa

## 2014-05-11 NOTE — Telephone Encounter (Signed)
Pt was advised to establish with different PCP, he declined making an appointment.

## 2014-05-11 NOTE — Telephone Encounter (Signed)
Agree - pt needs to establish with new PCP (not me as my panel is closed to new pts) for requested order thanks

## 2014-06-08 ENCOUNTER — Encounter: Payer: Self-pay | Admitting: Gastroenterology

## 2016-06-06 ENCOUNTER — Encounter: Payer: Self-pay | Admitting: Student

## 2016-09-13 ENCOUNTER — Encounter: Payer: Self-pay | Admitting: Gastroenterology

## 2023-07-12 DEATH — deceased
